# Patient Record
Sex: Male | Born: 1990 | Race: Black or African American | Hispanic: No | Marital: Single | Smoking: Current every day smoker
Health system: Southern US, Community
[De-identification: ages and names within clinical notes are randomized; demographics above are authoritative.]

## PROBLEM LIST (undated history)

## (undated) DIAGNOSIS — R569 Unspecified convulsions: Secondary | ICD-10-CM

---
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
## 2012-05-25 ENCOUNTER — Encounter (HOSPITAL_COMMUNITY): Payer: Self-pay | Admitting: Emergency Medicine

## 2012-05-25 DIAGNOSIS — S01501A Unspecified open wound of lip, initial encounter: Secondary | ICD-10-CM | POA: Insufficient documentation

## 2012-05-25 DIAGNOSIS — Y9389 Activity, other specified: Secondary | ICD-10-CM | POA: Insufficient documentation

## 2012-05-25 DIAGNOSIS — W208XXA Other cause of strike by thrown, projected or falling object, initial encounter: Secondary | ICD-10-CM | POA: Insufficient documentation

## 2012-05-25 DIAGNOSIS — Y998 Other external cause status: Secondary | ICD-10-CM | POA: Insufficient documentation

## 2012-05-25 DIAGNOSIS — F17200 Nicotine dependence, unspecified, uncomplicated: Secondary | ICD-10-CM | POA: Insufficient documentation

## 2012-05-25 NOTE — ED Notes (Signed)
PT. REPORTS ELECTRIC FAN ACCIDENTALLY FELL ON HIS FACE THIS EVENING , PRESENTS WITH UPPER LIP LACERATION APPROX. 1/2 INCH , BLEEDING CONTROLLED.

## 2012-05-26 ENCOUNTER — Emergency Department (HOSPITAL_COMMUNITY)
Admission: EM | Admit: 2012-05-26 | Discharge: 2012-05-26 | Disposition: A | Discharge status: Home / Self Care | Payer: Self-pay | Attending: Emergency Medicine | Admitting: Emergency Medicine

## 2012-05-26 DIAGNOSIS — S01511A Laceration without foreign body of lip, initial encounter: Secondary | ICD-10-CM

## 2012-05-26 NOTE — ED Provider Notes (Signed)
Medical screening examination/treatment/procedure(s) were performed by non-physician practitioner and as supervising physician I was immediately available for consultation/collaboration.  Olivia Mackie, MD 05/26/12 [phone]

## 2012-05-26 NOTE — ED Provider Notes (Signed)
History     CSN: [id number]  Arrival date & time 05/25/12  2343   None     Chief Complaint  Patient presents with  . Lip Laceration    (Consider location/radiation/quality/duration/timing/severity/associated sxs/prior treatment) HPI Comments: Patient state he removed the face of the fan to allow more, air to circulate, and in, his sleep he accidentally pulled the cord.  The fan fell onto his face lacerating his upper lip.  He denies any other injury.  He did not lose consciousness.  Bleeding has stopped with pressure.  He is up-to-date on his tetanus status  The history is provided by the patient.    History reviewed. No pertinent past medical history.  History reviewed. No pertinent past surgical history.  No family history on file.  History  Substance Use Topics  . Smoking status: Current Everyday Smoker  . Smokeless tobacco: Not on file  . Alcohol Use: No      Review of Systems  Constitutional: Negative for chills.  HENT: Negative for neck pain.   Skin: Positive for wound.  Neurological: Negative for dizziness and headaches.    Allergies  Review of patient's allergies indicates no known allergies.  Home Medications  No current outpatient prescriptions on file.  BP 122/76  Pulse 62  Temp 98.9 F (37.2 C) (Oral)  Resp 20  SpO2 100%  Physical Exam  Constitutional: He appears well-developed and well-nourished.  HENT:  Head: Normocephalic.  Eyes: Pupils are equal, round, and reactive to light.  Neck: Normal range of motion.  Cardiovascular: Normal rate.   Pulmonary/Chest: Effort normal.  Musculoskeletal: Normal range of motion.  Neurological: He is alert.  Skin: Skin is warm.       Laceration to upper lip, just above the vermilion border    ED Course  LACERATION REPAIR Date/Time: 05/26/2012 1:14 AM Performed by: Arman Filter Authorized by: Arman Filter Consent: Verbal consent obtained. Patient understanding: patient states understanding of  the procedure being performed Patient identity confirmed: verbally with patient Time out: Immediately prior to procedure a "time out" was called to verify the correct patient, procedure, equipment, support staff and site/side marked as required. Body area: head/neck Location details: upper lip Full thickness lip laceration: yes Vermillion border involved: no Laceration length: 1.5 cm Foreign bodies: metal Tendon involvement: none Nerve involvement: none Vascular damage: no Anesthesia: local infiltration Local anesthetic: lidocaine 1% without epinephrine Anesthetic total: 1.5 ml Patient sedated: no Preparation: Patient was prepped and draped in the usual sterile fashion. Irrigation solution: saline Amount of cleaning: standard Degree of undermining: none Skin closure: 6-0 Prolene Number of sutures: 4 Approximation: loose Approximation difficulty: simple Patient tolerance: Patient tolerated the procedure well with no immediate complications.   (including critical care time)  Labs Reviewed - No data to display No results found.   1. Lip laceration       MDM   Laceration to upper lip, just above the vermilion border        Arman Filter, NP 05/26/12 0116  Arman Filter, NP 05/26/12 [phone]

## 2016-05-02 ENCOUNTER — Encounter (HOSPITAL_COMMUNITY): Payer: Self-pay | Admitting: Emergency Medicine

## 2016-05-02 ENCOUNTER — Emergency Department (HOSPITAL_COMMUNITY)
Admission: EM | Admit: 2016-05-02 | Discharge: 2016-05-02 | Disposition: A | Discharge status: Home / Self Care | Payer: Self-pay | Attending: Emergency Medicine | Admitting: Emergency Medicine

## 2016-05-02 DIAGNOSIS — L0231 Cutaneous abscess of buttock: Secondary | ICD-10-CM | POA: Insufficient documentation

## 2016-05-02 DIAGNOSIS — L0291 Cutaneous abscess, unspecified: Secondary | ICD-10-CM

## 2016-05-02 DIAGNOSIS — F17200 Nicotine dependence, unspecified, uncomplicated: Secondary | ICD-10-CM | POA: Insufficient documentation

## 2016-05-02 MED ORDER — LIDOCAINE-EPINEPHRINE (PF) 2 %-1:200000 IJ SOLN
10.0000 mL | Freq: Once | INTRAMUSCULAR | Status: AC
  Administered 2016-05-02: 10 mL
  Filled 2016-05-02: qty 20

## 2016-05-02 NOTE — ED Provider Notes (Signed)
CSN: [id number]     Arrival date & time 05/02/16  0908 History  By signing my name below, I, Evon Slackerrance Branch, attest that this documentation has been prepared under the direction and in the presence of Bethel BornKelly Marie Ibeth Fahmy, PA-C. Electronically Signed: Evon Slackerrance Branch, ED Scribe. 05/02/2016. 9:40 AM.    Chief Complaint  Patient presents with  . Abscess   The history is provided by the patient. No language interpreter was used.   HPI Comments: Townsend RogerLeonel Brickman is a 25 y.o. male with no significant medical history who presents to the Emergency Department complaining of abscess to right buttock onset 2 days prior. He reports associated pain. Pt denies any medications PTA. Pt states that the pain is worse when sitting on his buttocks. He works in [employer] and sits for most of the day using equipment. Pt denies drainage, fever nausea or vomiting. Reports hx of similar abscesses that resolved on its own.   History reviewed. No pertinent past medical history. History reviewed. No pertinent past surgical history. No family history on file. Social History  Substance Use Topics  . Smoking status: Current Every Day Smoker  . Smokeless tobacco: None  . Alcohol Use: No    Review of Systems  Constitutional: Negative for fever.  Gastrointestinal: Negative for nausea and vomiting.  Skin: Negative for wound.       +abscess right buttock     Allergies  Review of patient's allergies indicates no known allergies.  Home Medications   Prior to Admission medications   Not on File   BP 159/101 mmHg  Pulse 78  Temp(Src) 98.9 F (37.2 C) (Oral)  Resp 20  SpO2 99%   Physical Exam  Constitutional: He is oriented to person, place, and time. He appears well-developed and well-nourished. No distress.  HENT:  Head: Normocephalic and atraumatic.  Eyes: Conjunctivae are normal. Pupils are equal, round, and reactive to light. Right eye exhibits no discharge. Left eye exhibits no discharge. No scleral  icterus.  Neck: Normal range of motion.  Cardiovascular: Normal rate.   Pulmonary/Chest: Effort normal. No respiratory distress.  Abdominal: Soft. He exhibits no distension.  Neurological: He is alert and oriented to person, place, and time.  Skin: Skin is warm and dry.  3cm fluctuant perirectal abscess confirmed with US. No drainage. Minimal erythema. Very tender to palpation  Psychiatric: He has a normal mood and affect.    ED Course  Procedures (including critical care time) DIAGNOSTIC STUDIES: Oxygen Saturation is 99% on RA, normal by my interpretation.    COORDINATION OF CARE: 9:39 AM-Discussed treatment plan which includes ultrasound buttock with pt at bedside and pt agreed to plan.     Labs Review Labs Reviewed - No data to display  Imaging Review No results found.    EKG Interpretation None     INCISION AND DRAINAGE Performed by: Bethel BornKelly Marie Daisha Filosa Consent: Verbal consent obtained. Risks and benefits: risks, benefits and alternatives were discussed Type: abscess  Body area: right buttock  Anesthesia: local infiltration  Incision was made with a scalpel (11 blade)  Local anesthetic: lidocaine 2% with epinephrine  Anesthetic total: 5 ml  Complexity: simple Blunt dissection to break up loculations  Drainage: purulent  Drainage amount: 3cc  Packing material: 1/4 in iodoform gauze  Patient tolerance: Patient tolerated the procedure well with no immediate complications.    MDM   Final diagnoses:  Abscess   25 year old male who presents with a perirectal abscess. I&D performed with significant relief.  Wound was packed and dressed. Advised follow up in 2 days for wound recheck and possible repacking. Discussed keeping area clean and dry, especially after a BM. Antibiotics not indicated at this time as patient has no significant medical history.  I personally performed the services described in this documentation, which was scribed in my presence. The  recorded information has been reviewed and is accurate.      Bethel BornKelly Marie Juanice Warburton, PA-C 05/02/16 1320  Geoffery Lyonsouglas Delo, MD 05/02/16 1539

## 2016-05-02 NOTE — ED Notes (Signed)
Pt reports he has an "abscess on my butt". Onset Wednesday (2 days ago). Hx of same. Girlfriend at bedside.

## 2016-05-02 NOTE — Discharge Instructions (Signed)
Incision and Drainage Incision and drainage is a procedure in which a sac-like structure (cystic structure) is opened and drained. The area to be drained usually contains material such as pus, fluid, or blood.  LET YOUR CAREGIVER KNOW ABOUT:   Allergies to medicine.  Medicines taken, including vitamins, herbs, eyedrops, over-the-counter medicines, and creams.  Use of steroids (by mouth or creams).  Previous problems with anesthetics or numbing medicines.  History of bleeding problems or blood clots.  Previous surgery.  Other health problems, including diabetes and kidney problems.  Possibility of pregnancy, if this applies. RISKS AND COMPLICATIONS  Pain.  Bleeding.  Scarring.  Infection. BEFORE THE PROCEDURE  You may need to have an ultrasound or other imaging tests to see how large or deep your cystic structure is. Blood tests may also be used to determine if you have an infection or how severe the infection is. You may need to have a tetanus shot. PROCEDURE  The affected area is cleaned with a cleaning fluid. The cyst area will then be numbed with a medicine (local anesthetic). A small incision will be made in the cystic structure. A syringe or catheter may be used to drain the contents of the cystic structure, or the contents may be squeezed out. The area will then be flushed with a cleansing solution. After cleansing the area, it is often gently packed with a gauze or another wound dressing. Once it is packed, it will be covered with gauze and tape or some other type of wound dressing. AFTER THE PROCEDURE   Often, you will be allowed to go home right after the procedure.  You may be given antibiotic medicine to prevent or heal an infection.  If the area was packed with gauze or some other wound dressing, you will likely need to come back in 1 to 2 days to get it removed.  The area should heal in about 14 days.   This information is not intended to replace advice given  to you by your health care provider. Make sure you discuss any questions you have with your health care provider.   Document Released: 04/08/2001 Document Revised: 04/13/2012 Document Reviewed: 12/08/2011 Elsevier Interactive Patient Education 2016 Else[employer] Inc[employer]  

## 2016-08-04 ENCOUNTER — Encounter (HOSPITAL_COMMUNITY): Payer: Self-pay

## 2016-08-04 ENCOUNTER — Emergency Department (HOSPITAL_COMMUNITY): Payer: Self-pay

## 2016-08-04 DIAGNOSIS — R0602 Shortness of breath: Secondary | ICD-10-CM | POA: Insufficient documentation

## 2016-08-04 DIAGNOSIS — F17200 Nicotine dependence, unspecified, uncomplicated: Secondary | ICD-10-CM | POA: Insufficient documentation

## 2016-08-04 DIAGNOSIS — R002 Palpitations: Secondary | ICD-10-CM | POA: Insufficient documentation

## 2016-08-04 LAB — CBC
HEMATOCRIT: 41.3 % (ref 39.0–52.0)
HEMOGLOBIN: 14.3 g/dL (ref 13.0–17.0)
MCH: 31.6 pg (ref 26.0–34.0)
MCHC: 34.6 g/dL (ref 30.0–36.0)
MCV: 91.2 fL (ref 78.0–100.0)
Platelets: 182 10*3/uL (ref 150–400)
RBC: 4.53 MIL/uL (ref 4.22–5.81)
RDW: 14.5 % (ref 11.5–15.5)
WBC: 6 10*3/uL (ref 4.0–10.5)

## 2016-08-04 LAB — BASIC METABOLIC PANEL
ANION GAP: 15 (ref 5–15)
BUN: <5 mg/dL — ABNORMAL LOW (ref 6–20)
CO2: 23 mmol/L (ref 22–32)
Calcium: 9.2 mg/dL (ref 8.9–10.3)
Chloride: 102 mmol/L (ref 101–111)
Creatinine, Ser: 0.84 mg/dL (ref 0.61–1.24)
GFR calc Af Amer: >60 mL/min (ref >60)
GFR calc non Af Amer: >60 mL/min (ref >60)
GLUCOSE: 149 mg/dL — AB (ref 65–99)
POTASSIUM: 3.6 mmol/L (ref 3.5–5.1)
Sodium: 140 mmol/L (ref 135–145)

## 2016-08-04 LAB — I-STAT TROPONIN, ED: Troponin i, poc: 0 ng/mL (ref 0.00–0.08)

## 2016-08-04 NOTE — ED Triage Notes (Signed)
Pt complaining of generalized body aches and SOB. Pt denies any cough or chest pain. Pt complaining of vague symptoms. Pt denies any injury/trauma.

## 2016-08-05 ENCOUNTER — Emergency Department (HOSPITAL_COMMUNITY)
Admission: EM | Admit: 2016-08-05 | Discharge: 2016-08-05 | Disposition: A | Discharge status: Home / Self Care | Payer: Self-pay | Attending: Emergency Medicine | Admitting: Emergency Medicine

## 2016-08-05 DIAGNOSIS — R002 Palpitations: Secondary | ICD-10-CM

## 2016-08-05 DIAGNOSIS — R0602 Shortness of breath: Secondary | ICD-10-CM

## 2016-08-05 NOTE — ED Provider Notes (Signed)
[facility]-EMERGENCY DEPT Provider Note   CSN: [id number] Arrival date & time: 08/04/16  2137  By signing my name below, I, Rosario AdieWilliam Andrew Hiatt, attest that this documentation has been prepared under the direction and in the presence of Tomasita CrumbleAdeleke Adriane Gabbert, MD. Electronically Signed: Rosario AdieWilliam Andrew Hiatt, ED Scribe. 08/05/16. 2:59 AM.  History    Chief Complaint  Patient presents with  . Shortness of Breath   The history is provided by the patient. No language interpreter was used.   HPI Comments: John Krueger is a 25 y.o. male with no other pertinent PMHx, who presents to the Emergency Department complaining of intermittent, recurrent episodes of sensation of palpitations w/ associated SOB onset ~2 months ago. Pt reports that his episodes are typically only associated with mild exertion and that "driving" will exacerbate his symptoms. He states that his symptoms will be relieved by sleeping. No other treatments were tired prior to coming into the ED. No h/o cardiac issues. He denies any recent trauma or injuries. Denies cough, chest pain, fevers, or any other associated symptoms.    History reviewed. No pertinent past medical history.  There are no active problems to display for this patient.  History reviewed. No pertinent surgical history.  Home Medications    Prior to Admission medications   Not on File   Family History History reviewed. No pertinent family history.   Social History Social History  Substance Use Topics  . Smoking status: Current Every Day Smoker  . Smokeless tobacco: Not on file  . Alcohol use No   Allergies   Review of patient's allergies indicates no known allergies.  Review of Systems Review of Systems  A complete 10 system review of systems was obtained and all systems are negative except as noted in the HPI and PMH.   Physical Exam Updated Vital Signs BP 126/93   Pulse 81   Temp 98.5 F (36.9 C) (Oral)   Resp 20   SpO2 98%   Physical Exam    Constitutional: He is oriented to person, place, and time. Vital signs are normal. He appears well-developed and well-nourished.  Non-toxic appearance. He does not appear ill. No distress.  HENT:  Head: Normocephalic and atraumatic.  Nose: Nose normal.  Mouth/Throat: Oropharynx is clear and moist. No oropharyngeal exudate.  Eyes: Conjunctivae and EOM are normal. Pupils are equal, round, and reactive to light. No scleral icterus.  Neck: Normal range of motion. Neck supple. No tracheal deviation, no edema, no erythema and normal range of motion present. No thyroid mass and no thyromegaly present.  Cardiovascular: Normal rate, regular rhythm, S1 normal, S2 normal, normal heart sounds, intact distal pulses and normal pulses.  Exam reveals no gallop and no friction rub.   No murmur heard. Pulmonary/Chest: Effort normal and breath sounds normal. No respiratory distress. He has no wheezes. He has no rhonchi. He has no rales.  Abdominal: Soft. Normal appearance and bowel sounds are normal. He exhibits no distension, no ascites and no mass. There is no hepatosplenomegaly. There is no tenderness. There is no rebound, no guarding and no CVA tenderness.  Musculoskeletal: Normal range of motion. He exhibits no edema or tenderness.  Lymphadenopathy:    He has no cervical adenopathy.  Neurological: He is alert and oriented to person, place, and time. He has normal strength. No cranial nerve deficit or sensory deficit.  Skin: Skin is warm, dry and intact. No petechiae and no rash noted. He is not diaphoretic. No erythema. No pallor.  Nursing  note and vitals reviewed.  ED Treatments / Results  DIAGNOSTIC STUDIES: Oxygen Saturation is 98% on RA, normal by my interpretation.   COORDINATION OF CARE: 2:46 AM-Discussed next steps with pt. Pt verbalized understanding and is agreeable with the plan.   Labs (all labs ordered are listed, but only abnormal results are displayed) Labs Reviewed  BASIC METABOLIC  PANEL - Abnormal; Notable for the following:       Result Value   Glucose, Bld 149 (*)    BUN <5 (*)    All other components within normal limits  CBC  I-STAT TROPOININ, ED   EKG  EKG Interpretation  Date/Time:  Monday August 04 2016 21:46:23 EDT Ventricular Rate:  86 PR Interval:  130 QRS Duration: 86 QT Interval:  326 QTC Calculation: 390 R Axis:   54 Text Interpretation:  Normal sinus rhythm Minimal voltage criteria for LVH, may be normal variant Borderline ECG No old tracing to compare Confirmed by Erroll Luna [id number]) on 08/05/2016 2:35:19 AM      Radiology Dg Chest 2 View  Result Date: 08/04/2016 CLINICAL DATA:  Shortness of breath, generalized body aches. EXAM: CHEST  2 VIEW COMPARISON:  03/07/2011 FINDINGS: The cardiomediastinal contours are normal. The lungs are clear. Pulmonary vasculature is normal. No consolidation, pleural effusion, or pneumothorax. No acute osseous abnormalities are seen. IMPRESSION: No acute pulmonary process. Electronically Signed   By: Rubye Oaks M.D.   On: 08/04/2016 22:47   Procedures Procedures   Medications Ordered in ED Medications - No data to display  Initial Impression / Assessment and Plan / ED Course  I have reviewed the triage vital signs and the nursing notes.  Pertinent labs & imaging results that were available during my care of the patient were reviewed by me and considered in my medical decision making (see chart for details).  Clinical Course   Patient presents emergency department for sudden onset shortness of breath and palpitations. This has been going on for a long time. I do not believe this represents ACS. He has perked negative. Troponin I was lubricated ordered by triage is negative. EKG is unremarkable. Chest x-ray is normal. Laboratory studies are normal. Patient advised to follow-up with primary care physician for further care. He appears well in no acute distress. There is no hypoxia or tachycardia.  He is calm. Vital signs were within his normal limits and he is safe for discharge.  Final Clinical Impressions(s) / ED Diagnoses   Final diagnoses:  None   New Prescriptions New Prescriptions   No medications on file   I personally performed the services described in this documentation, which was scribed in my presence. The recorded information has been reviewed and is accurate.       Tomasita Crumble, MD 08/05/16 [phone]

## 2018-02-20 ENCOUNTER — Emergency Department (HOSPITAL_COMMUNITY)
Admission: EM | Admit: 2018-02-20 | Discharge: 2018-02-21 | Disposition: A | Discharge status: Home / Self Care | Payer: Self-pay | Attending: Emergency Medicine | Admitting: Emergency Medicine

## 2018-02-20 ENCOUNTER — Encounter (HOSPITAL_COMMUNITY): Payer: Self-pay | Admitting: Emergency Medicine

## 2018-02-20 ENCOUNTER — Other Ambulatory Visit: Payer: Self-pay

## 2018-02-20 DIAGNOSIS — K529 Noninfective gastroenteritis and colitis, unspecified: Secondary | ICD-10-CM | POA: Insufficient documentation

## 2018-02-20 DIAGNOSIS — F17200 Nicotine dependence, unspecified, uncomplicated: Secondary | ICD-10-CM | POA: Insufficient documentation

## 2018-02-20 LAB — CBC
HCT: 40.4 % (ref 39.0–52.0)
HEMOGLOBIN: 13.9 g/dL (ref 13.0–17.0)
MCH: 30.6 pg (ref 26.0–34.0)
MCHC: 34.4 g/dL (ref 30.0–36.0)
MCV: 89 fL (ref 78.0–100.0)
PLATELETS: 102 10*3/uL — AB (ref 150–400)
RBC: 4.54 MIL/uL (ref 4.22–5.81)
RDW: 14.2 % (ref 11.5–15.5)
WBC: 6.6 10*3/uL (ref 4.0–10.5)

## 2018-02-20 LAB — COMPREHENSIVE METABOLIC PANEL
ALK PHOS: 109 U/L (ref 38–126)
ALT: 82 U/L — ABNORMAL HIGH (ref 17–63)
ANION GAP: 11 (ref 5–15)
AST: 100 U/L — ABNORMAL HIGH (ref 15–41)
Albumin: 3.9 g/dL (ref 3.5–5.0)
BILIRUBIN TOTAL: 1 mg/dL (ref 0.3–1.2)
BUN: 7 mg/dL (ref 6–20)
CALCIUM: 9.2 mg/dL (ref 8.9–10.3)
CO2: 22 mmol/L (ref 22–32)
CREATININE: 0.95 mg/dL (ref 0.61–1.24)
Chloride: 97 mmol/L — ABNORMAL LOW (ref 101–111)
Glucose, Bld: 117 mg/dL — ABNORMAL HIGH (ref 65–99)
Potassium: 3.8 mmol/L (ref 3.5–5.1)
SODIUM: 130 mmol/L — AB (ref 135–145)
TOTAL PROTEIN: 7.6 g/dL (ref 6.5–8.1)

## 2018-02-20 LAB — TYPE AND SCREEN
ABO/RH(D): O POS
Antibody Screen: NEGATIVE

## 2018-02-20 NOTE — ED Triage Notes (Signed)
Patient presents to ED for assessment of 5 episodes of rectal bleeding today with emesis and abdominal pain.  Tender with palpation.  Denies known injury.  Denies blood thinner.   Patient states he cannot keep anything down.

## 2018-02-21 ENCOUNTER — Emergency Department (HOSPITAL_COMMUNITY): Payer: Self-pay

## 2018-02-21 LAB — ABO/RH: ABO/RH(D): O POS

## 2018-02-21 LAB — POC OCCULT BLOOD, ED: FECAL OCCULT BLD: POSITIVE — AB

## 2018-02-21 MED ORDER — CIPROFLOXACIN HCL 500 MG PO TABS: 500.0000 mg | ORAL_TABLET | Freq: Two times a day (BID) | ORAL | 0 refills | Status: DC

## 2018-02-21 MED ORDER — IOPAMIDOL (ISOVUE-300) INJECTION 61%
INTRAVENOUS | Status: AC
  Filled 2018-02-21: qty 100

## 2018-02-21 MED ORDER — HYDROCODONE-ACETAMINOPHEN 5-325 MG PO TABS: 1.0000 | ORAL_TABLET | Freq: Four times a day (QID) | ORAL | 0 refills | Status: DC | PRN

## 2018-02-21 MED ORDER — METRONIDAZOLE 500 MG PO TABS: 500.0000 mg | ORAL_TABLET | Freq: Three times a day (TID) | ORAL | 0 refills | Status: DC

## 2018-02-21 MED ORDER — SODIUM CHLORIDE 0.9 % IV BOLUS
1000.0000 mL | Freq: Once | INTRAVENOUS | Status: AC
  Administered 2018-02-21: 1000 mL via INTRAVENOUS

## 2018-02-21 MED ORDER — IOPAMIDOL (ISOVUE-300) INJECTION 61%
100.0000 mL | Freq: Once | INTRAVENOUS | Status: AC | PRN
  Administered 2018-02-21: 100 mL via INTRAVENOUS

## 2018-02-21 NOTE — ED Notes (Signed)
To CT

## 2018-02-21 NOTE — ED Provider Notes (Signed)
[name] EMERGENCY DEPARTMENT Provider Note   CSN: [id number] Arrival date & time: 02/20/18  1948     History   Chief Complaint Chief Complaint  Patient presents with  . Rectal Bleeding    HPI John Krueger is a 27 y.o. male.  Patient is a 27 year old male presenting with complaints of abdominal pain and rectal bleeding.  This is been ongoing for 24 hours.  He states that he ate at a restaurant prior to the onset of symptoms and thought that he had food poisoning.  He has been taking Pepto-Bismol with little relief.  He reports pain in his lower abdomen along with loose, bloody stools.  The stool is bright red.  The history is provided by the patient.  Rectal Bleeding  Quality:  Bright red Amount:  Moderate Duration:  1 day Timing:  Constant Chronicity:  New Context: diarrhea   Context: not anal fissures, not hemorrhoids and not rectal injury   Relieved by:  Nothing Worsened by:  Nothing   History reviewed. No pertinent past medical history.  There are no active problems to display for this patient.   History reviewed. No pertinent surgical history.      Home Medications    Prior to Admission medications   Not on File    Family History History reviewed. No pertinent family history.  Social History Social History   Tobacco Use  . Smoking status: Current Every Day Smoker  . Smokeless tobacco: Never Used  Substance Use Topics  . Alcohol use: No  . Drug use: No     Allergies   Patient has no known allergies.   Review of Systems Review of Systems  Gastrointestinal: Positive for hematochezia.  All other systems reviewed and are negative.    Physical Exam Updated Vital Signs BP (!) 136/98 (BP Location: Right Arm)   Pulse (!) 101   Temp 99.6 F (37.6 C) (Oral)   Resp 16   SpO2 99%   Physical Exam  Constitutional: He is oriented to person, place, and time. He appears well-developed and well-nourished. No distress.  HENT:    Head: Normocephalic and atraumatic.  Mouth/Throat: Oropharynx is clear and moist.  Neck: Normal range of motion. Neck supple.  Cardiovascular: Normal rate and regular rhythm. Exam reveals no friction rub.  No murmur heard. Pulmonary/Chest: Effort normal and breath sounds normal. No respiratory distress. He has no wheezes. He has no rales.  Abdominal: Soft. Bowel sounds are normal. He exhibits no distension. There is tenderness. There is no rebound and no guarding.  There is tenderness to palpation across the lower abdomen in the suprapubic region, right lower quadrant, and left lower quadrant.  Genitourinary: Rectum normal.  Genitourinary Comments: There is bright red blood present in the rectum.  There are no obvious fissures or hemorrhoids.  Musculoskeletal: Normal range of motion. He exhibits no edema.  Neurological: He is alert and oriented to person, place, and time. Coordination normal.  Skin: Skin is warm and dry. He is not diaphoretic.  Nursing note and vitals reviewed.    ED Treatments / Results  Labs (all labs ordered are listed, but only abnormal results are displayed) Labs Reviewed  COMPREHENSIVE METABOLIC PANEL - Abnormal; Notable for the following components:      Result Value   Sodium 130 (*)    Chloride 97 (*)    Glucose, Bld 117 (*)    AST 100 (*)    ALT 82 (*)    All other  components within normal limits  CBC - Abnormal; Notable for the following components:   Platelets 102 (*)    All other components within normal limits  POC OCCULT BLOOD, ED  POC OCCULT BLOOD, ED  TYPE AND SCREEN  ABO/RH    EKG None  Radiology No results found.  Procedures Procedures (including critical care time)  Medications Ordered in ED Medications  sodium chloride 0.9 % bolus 1,000 mL (has no administration in time range)     Initial Impression / Assessment and Plan / ED Course  I have reviewed the triage vital signs and the nursing notes.  Pertinent labs & imaging  results that were available during my care of the patient were reviewed by me and considered in my medical decision making (see chart for details).  Patient presents with suprapubic abdominal pain and passing bloody stools since yesterday.  He also reports diarrhea.  His work-up today reveals no elevation of white count, but he does have gross blood on rectal exam.  His CT scan today shows what appears to be colitis.  He will be treated with Cipro and Flagyl, pain medicine, and will follow up with gastroenterology later this week.  I am uncertain as to whether the etiology of his colitis is infectious or related to an inflammatory cause, but favor infectious given his presentation.  Final Clinical Impressions(s) / ED Diagnoses   Final diagnoses:  None    ED Discharge Orders    None       Geoffery Lyons, MD 02/21/18 [phone]

## 2018-02-21 NOTE — Discharge Instructions (Signed)
Cipro and Flagyl as prescribed.  Hydrocodone as prescribed as needed for pain.  Follow-up with gastroenterology in the next 3 to 4 days.  The contact information for [facility] has been provided in this discharge summary for you to call and make these arrangements.  Return to the emergency department in the meantime if you develop worsening pain, high fevers, worsening bleeding, or other new and concerning symptoms.

## 2020-03-05 ENCOUNTER — Other Ambulatory Visit: Payer: Self-pay

## 2020-03-05 ENCOUNTER — Encounter (HOSPITAL_COMMUNITY): Payer: Self-pay

## 2020-03-05 ENCOUNTER — Emergency Department (HOSPITAL_COMMUNITY)
Admission: EM | Admit: 2020-03-05 | Discharge: 2020-03-06 | Disposition: A | Discharge status: Home / Self Care | Payer: Self-pay | Attending: Emergency Medicine | Admitting: Emergency Medicine

## 2020-03-05 DIAGNOSIS — F17210 Nicotine dependence, cigarettes, uncomplicated: Secondary | ICD-10-CM | POA: Insufficient documentation

## 2020-03-05 DIAGNOSIS — E860 Dehydration: Secondary | ICD-10-CM | POA: Insufficient documentation

## 2020-03-05 DIAGNOSIS — R404 Transient alteration of awareness: Secondary | ICD-10-CM | POA: Insufficient documentation

## 2020-03-05 LAB — CBG MONITORING, ED: Glucose-Capillary: 108 mg/dL — ABNORMAL HIGH (ref 70–99)

## 2020-03-05 NOTE — ED Triage Notes (Signed)
Pt bib g[facility]ms w/ c/o possible seizure. 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 Coworkers did not see any seizure-like activity, however pt is amnesic to event, was AOx1 when waking up, and foaming at the mouth per EMS. Pt AOx3/4 w/ EMS and able to reorient. No hx of seizures, no incontinence noted. CBG 115  EMS VS:  138/90 HR 110 RR 20 SPO2 100%

## 2020-03-06 ENCOUNTER — Emergency Department (HOSPITAL_COMMUNITY): Payer: Self-pay

## 2020-03-06 LAB — URINALYSIS, ROUTINE W REFLEX MICROSCOPIC
Bacteria, UA: NONE SEEN
Bilirubin Urine: NEGATIVE
Glucose, UA: NEGATIVE mg/dL
Ketones, ur: 5 mg/dL — AB
Leukocytes,Ua: NEGATIVE
Nitrite: NEGATIVE
Protein, ur: 100 mg/dL — AB
Specific Gravity, Urine: 1.017 (ref 1.005–1.030)
pH: 5 (ref 5.0–8.0)

## 2020-03-06 LAB — COMPREHENSIVE METABOLIC PANEL
ALT: 100 U/L — ABNORMAL HIGH (ref 0–44)
AST: 130 U/L — ABNORMAL HIGH (ref 15–41)
Albumin: 4.5 g/dL (ref 3.5–5.0)
Alkaline Phosphatase: 90 U/L (ref 38–126)
Anion gap: 20 — ABNORMAL HIGH (ref 5–15)
BUN: 8 mg/dL (ref 6–20)
CO2: 18 mmol/L — ABNORMAL LOW (ref 22–32)
Calcium: 9.8 mg/dL (ref 8.9–10.3)
Chloride: 97 mmol/L — ABNORMAL LOW (ref 98–111)
Creatinine, Ser: 1.03 mg/dL (ref 0.61–1.24)
GFR calc Af Amer: >60 mL/min (ref >60)
GFR calc non Af Amer: >60 mL/min (ref >60)
Glucose, Bld: 106 mg/dL — ABNORMAL HIGH (ref 70–99)
Potassium: 4.5 mmol/L (ref 3.5–5.1)
Sodium: 135 mmol/L (ref 135–145)
Total Bilirubin: 0.7 mg/dL (ref 0.3–1.2)
Total Protein: 8 g/dL (ref 6.5–8.1)

## 2020-03-06 LAB — ETHANOL: Alcohol, Ethyl (B): <10 mg/dL (ref <10)

## 2020-03-06 LAB — RAPID URINE DRUG SCREEN, HOSP PERFORMED
Amphetamines: NOT DETECTED
Barbiturates: NOT DETECTED
Benzodiazepines: NOT DETECTED
Cocaine: NOT DETECTED
Opiates: NOT DETECTED
Tetrahydrocannabinol: NOT DETECTED

## 2020-03-06 LAB — CBC WITH DIFFERENTIAL/PLATELET
Abs Immature Granulocytes: 0.02 10*3/uL (ref 0.00–0.07)
Basophils Absolute: 0 10*3/uL (ref 0.0–0.1)
Basophils Relative: 1 %
Eosinophils Absolute: 0.1 10*3/uL (ref 0.0–0.5)
Eosinophils Relative: 1 %
HCT: 41 % (ref 39.0–52.0)
Hemoglobin: 13.8 g/dL (ref 13.0–17.0)
Immature Granulocytes: 0 %
Lymphocytes Relative: 31 %
Lymphs Abs: 1.5 10*3/uL (ref 0.7–4.0)
MCH: 30.9 pg (ref 26.0–34.0)
MCHC: 33.7 g/dL (ref 30.0–36.0)
MCV: 91.9 fL (ref 80.0–100.0)
Monocytes Absolute: 0.5 10*3/uL (ref 0.1–1.0)
Monocytes Relative: 10 %
Neutro Abs: 2.8 10*3/uL (ref 1.7–7.7)
Neutrophils Relative %: 57 %
Platelets: 113 10*3/uL — ABNORMAL LOW (ref 150–400)
RBC: 4.46 MIL/uL (ref 4.22–5.81)
RDW: 15.8 % — ABNORMAL HIGH (ref 11.5–15.5)
WBC: 5 10*3/uL (ref 4.0–10.5)
nRBC: 0 % (ref 0.0–0.2)

## 2020-03-06 LAB — ACETAMINOPHEN LEVEL: Acetaminophen (Tylenol), Serum: <10 ug/mL — ABNORMAL LOW (ref 10–30)

## 2020-03-06 LAB — SALICYLATE LEVEL: Salicylate Lvl: <7 mg/dL — ABNORMAL LOW (ref 7.0–30.0)

## 2020-03-06 LAB — AMMONIA: Ammonia: 53 umol/L — ABNORMAL HIGH (ref 9–35)

## 2020-03-06 LAB — LACTIC ACID, PLASMA: Lactic Acid, Venous: 2.6 mmol/L (ref 0.5–1.9)

## 2020-03-06 MED ORDER — LACTATED RINGERS IV BOLUS
2000.0000 mL | Freq: Once | INTRAVENOUS | Status: AC
  Administered 2020-03-06: 2000 mL via INTRAVENOUS

## 2020-03-06 NOTE — ED Notes (Signed)
Urine culture sent down with u/a and uds

## 2020-03-06 NOTE — ED Notes (Signed)
Pt ambulated in hallway, noted to have a steady gait.

## 2020-03-06 NOTE — ED Notes (Signed)
Informed pt of need for urine specimen. Verbalizes understanding.  

## 2020-03-06 NOTE — ED Notes (Signed)
Discharge instructions reviewed with pt. Pt verbalized understanding that he is to follow up with neurology and is not allowed to drive until he is cleared by them

## 2020-03-06 NOTE — ED Provider Notes (Signed)
[name] EMERGENCY DEPARTMENT Provider Note   CSN: [id number] Arrival date & time: 03/05/20  2323     History Chief Complaint  Patient presents with  . Seizures    Khameron Gruenwald is a 29 y.o. male.  The history is provided by the patient.  Altered Mental Status Presenting symptoms: confusion   Severity:  Moderate Progression:  Improving Chronicity:  New Context: not alcohol use, not head injury, taking medications as prescribed and not recent change in medication   Associated symptoms: no fever and no weakness   Patient is an otherwise healthy 29 year old male.  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  He took a smoking break and started to feel different and dizzy.  Patient cannot recall the events of what happened at work.  Coworkers reported patient was unresponsive for 2 minutes.  No seizure activity was reported, but he was amnestic to the event.  EMS reports patient was "foaming at the mouth " Patient has no known history of seizures.  No incontinence.  No tongue biting. He reports he has been feeling well recently. He reports he smokes [place] cigarettes, no drug use    PMH-none Soc hx - denies drug/etoh use, smokes cigarettes Social History   Tobacco Use  . Smoking status: Current Every Day Smoker  . Smokeless tobacco: Never Used  Substance Use Topics  . Alcohol use: No  . Drug use: No    Home Medications Prior to Admission medications   Not on File    Allergies    Patient has no known allergies.  Review of Systems   Review of Systems  Constitutional: Negative for fever.  Respiratory: Negative for shortness of breath.   Cardiovascular: Negative for chest pain.  Neurological: Negative for weakness.  Psychiatric/Behavioral: Positive for confusion.  All other systems reviewed and are negative.   Physical Exam Updated Vital Signs BP 134/87 (BP Location: Left Arm)   Pulse  (!) 103   Temp 99.2 F (37.3 C) (Oral)   Resp 16   SpO2 97%   Physical Exam CONSTITUTIONAL: Well developed/well nourished HEAD: Normocephalic/atraumatic EYES: EOMI/PERRL ENMT: Mucous membranes moist NECK: supple no meningeal signs SPINE/BACK:entire spine nontender CV: S1/S2 noted, no murmurs/rubs/gallops noted LUNGS: Lungs are clear to auscultation bilaterally, no apparent distress ABDOMEN: soft, nontender, no rebound or guarding, bowel sounds noted throughout abdomen GU:no cva tenderness NEURO: Pt is awake/alert/appropriate, moves all extremitiesx4.  No facial droop.  EXTREMITIES: pulses normal/equal, full ROM SKIN: warm, color normal PSYCH: no abnormalities of mood noted, alert and oriented to situation ED Results / Procedures / Treatments   Labs (all labs ordered are listed, but only abnormal results are displayed) Labs Reviewed  CBC WITH DIFFERENTIAL/PLATELET - Abnormal; Notable for the following components:      Result Value   RDW 15.8 (*)    Platelets 113 (*)    All other components within normal limits  COMPREHENSIVE METABOLIC PANEL - Abnormal; Notable for the following components:   Chloride 97 (*)    CO2 18 (*)    Glucose, Bld 106 (*)    AST 130 (*)    ALT 100 (*)    Anion gap 20 (*)    All other components within normal limits  URINALYSIS, ROUTINE W REFLEX MICROSCOPIC - Abnormal; Notable for the following components:   APPearance HAZY (*)    Hgb urine dipstick SMALL (*)    Ketones, ur  5 (*)    Protein, ur 100 (*)    All other components within normal limits  AMMONIA - Abnormal; Notable for the following components:   Ammonia 53 (*)    All other components within normal limits  LACTIC ACID, PLASMA - Abnormal; Notable for the following components:   Lactic Acid, Venous 2.6 (*)    All other components within normal limits  SALICYLATE LEVEL - Abnormal; Notable for the following components:   Salicylate Lvl <7.0 (*)    All other components within normal limits   ACETAMINOPHEN LEVEL - Abnormal; Notable for the following components:   Acetaminophen (Tylenol), Serum <10 (*)    All other components within normal limits  CBG MONITORING, ED - Abnormal; Notable for the following components:   Glucose-Capillary 108 (*)    All other components within normal limits  ETHANOL  RAPID URINE DRUG SCREEN, HOSP PERFORMED    EKG EKG Interpretation  Date/Time:  Monday Mar 05 2020 23:58:36 EDT Ventricular Rate:  88 PR Interval:  130 QRS Duration: 82 QT Interval:  332 QTC Calculation: 401 R Axis:   60 Text Interpretation: Normal sinus rhythm Cannot rule out Anterior infarct , age undetermined Abnormal ECG No significant change since last tracing Confirmed by Zadie Rhine ([id number]) on 03/06/2020 12:06:34 AM   Radiology CT Head Wo Contrast  Result Date: 03/06/2020 CLINICAL DATA:  Altered mental status, possible seizure EXAM: CT HEAD WITHOUT CONTRAST TECHNIQUE: Contiguous axial images were obtained from the base of the skull through the vertex without intravenous contrast. COMPARISON:  None. FINDINGS: Brain: No evidence of acute territorial infarction, hemorrhage, hydrocephalus,extra-axial collection or mass lesion/mass effect. Normal gray-white differentiation. There is dilatation the ventricles and sulci out of proportion given the patient's age. Vascular: No hyperdense vessel or unexpected calcification. Skull: The skull is intact. No fracture or focal lesion identified. Sinuses/Orbits: The visualized paranasal sinuses and mastoid air cells are clear. The orbits and globes intact. Other: None IMPRESSION: No acute intracranial abnormality. Diffuse cerebral atrophy, advanced given the patient's age. Electronically Signed   By: Jonna Clark M.D.   On: 03/06/2020 00:25    Procedures Procedures    Medications Ordered in ED Medications  lactated ringers bolus 2,000 mL (2,000 mLs Intravenous New Bag/Given 03/06/20 0146)    ED Course  I have reviewed the triage  vital signs and the nursing notes.  Pertinent labs & imaging results that were available during my care of the patient were reviewed by me and considered in my medical decision making (see chart for details).    MDM Rules/Calculators/A&P                     12:05 AM Patient presents from work after an episode of altered mental status or possible seizure.  He appears to be nearly back to baseline at this time.  However he was initially amnestic to the event.  Altered mental status work-up has been initiated 2:38 AM Patient is improved.  No acute distress.  Labs reveal dehydration but otherwise near baseline Patient will need to be kept out of work and will refer to neurology for close follow-up.  It is unclear exactly what happened tonight the patient may have had a seizure at work. 4:42 AM Patient is improved BP 130/86 (BP Location: Right Arm)   Pulse 81   Temp 98.9 F (37.2 C) (Oral)   Resp 20   Ht 1.753 m (5\' 9" )   Wt 79.4 kg   SpO2 97%  BMI 25.84 kg/m  He is ambulatory in no distress.  He is taking p.o. fluids.  He is afebrile.  Vitals appropriate He is back to baseline.  Mental status appropriate.  No focal weakness. He still has difficulty recalling exactly what occurred at work.  I have a strong suspicion that he may have had a seizure though no witnesses were available. Labs revealed mildly elevated lactate, dehydration I suspect is due to a seizure.  Patient was given IV fluids.  He is not tachycardic or septic appearing He has no meningeal signs to suggest meningitis. Due to concern for possible seizure activity, patient would not be able to work until seen by neurology.  Urgent referral placed for out patient neurology.  Patient advised no driving or bathing alone.  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  The differential diagnosis includes seizures, head injury, ICH   Lab Tests:   I Ordered, reviewed, and interpreted labs, which included electrolytes, complete blood count, alcohol, drug screen, Tylenol level, salicylate level  Medicines ordered:   I ordered medication IV fluids for dehydration  Imaging Studies ordered:   I ordered imaging studies which included CT head   I independently visualized and interpreted imaging which showed no acute findings    Reevaluation:  After the interventions stated above, I reevaluated the patient and found patient is improved  Final Clinical Impression(s) / ED Diagnoses Final diagnoses:  Transient alteration of awareness  Dehydration    Rx / DC Orders ED Discharge Orders    None       Ripley Fraise, MD 03/06/20 [phone]

## 2020-03-06 NOTE — ED Notes (Signed)
Dr. Bebe Shaggy notified of pt lactic acid.

## 2020-03-06 NOTE — ED Notes (Signed)
Charge RN notified that Dr. Bebe Shaggy wants pt in a room.

## 2020-03-06 NOTE — Discharge Instructions (Addendum)
Please be aware you may have another seizure  Do not drive until seen by your physician for your condition  Do not climb ladders/roofs/trees as a seizure can occur at that height and cause serious harm  Do not bathe/swim alone as a seizure can occur and cause serious harm  Please followup with your physician or neurologist for further testing and possible treatment   RETURN IMMEDIATELY IF  you develop new shortness of breath, chest pain, fever, have difficulty moving parts of your body (new weakness, numbness, or incoordination), sudden change in speech, vision, swallowing, or understanding, faint or develop new dizziness, severe headache, become poorly responsive or have an altered mental status compared to baseline for you, new rash, abdominal pain, or bloody stools,  Return sooner also if you develop new problems for which you have not talked to your caregiver but you feel may be emergency medical conditions.

## 2020-03-07 ENCOUNTER — Telehealth: Payer: Self-pay | Admitting: *Deleted

## 2020-03-07 NOTE — Telephone Encounter (Signed)
Pt mom called regarding Rx from ED visit.  RNCM reviewed chart and did not find evidence of Rx written.  Advised mother of findings.

## 2020-10-26 ENCOUNTER — Emergency Department (HOSPITAL_COMMUNITY)
Admission: EM | Admit: 2020-10-26 | Discharge: 2020-10-26 | Disposition: A | Discharge status: Home / Self Care | Payer: PRIVATE HEALTH INSURANCE | Attending: Emergency Medicine | Admitting: Emergency Medicine

## 2020-10-26 ENCOUNTER — Other Ambulatory Visit: Payer: Self-pay

## 2020-10-26 DIAGNOSIS — L02214 Cutaneous abscess of groin: Secondary | ICD-10-CM | POA: Diagnosis not present

## 2020-10-26 DIAGNOSIS — F17200 Nicotine dependence, unspecified, uncomplicated: Secondary | ICD-10-CM | POA: Diagnosis not present

## 2020-10-26 MED ORDER — LIDOCAINE-EPINEPHRINE (PF) 2 %-1:200000 IJ SOLN
10.0000 mL | Freq: Once | INTRAMUSCULAR | Status: AC
  Administered 2020-10-26: 10 mL
  Filled 2020-10-26: qty 10

## 2020-10-26 MED ORDER — DOXYCYCLINE HYCLATE 100 MG PO CAPS
100.0000 mg | ORAL_CAPSULE | Freq: Two times a day (BID) | ORAL | 0 refills | Status: DC
Start: 2020-10-26 — End: 2022-02-23

## 2020-10-26 NOTE — ED Provider Notes (Signed)
[facility] EMERGENCY DEPARTMENT Provider Note   CSN: [id number] Arrival date & time: 10/26/20  6294     History Chief Complaint  Patient presents with  . Abscess    John Krueger is a 29 y.o. male.  John Krueger is a 29 y.o. male who is otherwise healthy, presents to the ED for evaluation of possible abscess to the left groin.  He reports this area has been present for 2 days.  It has become increasingly large and very painful.  He reports pain with sitting and walking.  Abscess is present in the fold between the groin and left thigh.  No drainage from the area.  Has had some chills but no documented fevers.  Denies prior history of similar abscesses.  No history of diabetes.  He does report that he shaves in this area typically.  No penile pain or discharge, no dysuria or urinary frequency.  No scrotal pain or swelling.  No other aggravating or alleviating factors.  The history is provided by the patient.       No past medical history on file.  There are no problems to display for this patient.   No past surgical history on file.     No family history on file.  Social History   Tobacco Use  . Smoking status: Current Every Day Smoker  . Smokeless tobacco: Never Used  Substance Use Topics  . Alcohol use: No  . Drug use: No    Home Medications Prior to Admission medications   Medication Sig Start Date End Date Taking? Authorizing Provider  doxycycline (VIBRAMYCIN) 100 MG capsule Take 1 capsule (100 mg total) by mouth 2 (two) times daily. One po bid x 7 days 10/26/20  Yes Dartha Lodge, PA-C  tetrahydrozoline 0.05 % ophthalmic solution Place 2 drops into both eyes daily as needed (Dry eyes).    [provider]    Allergies    Patient has no known allergies.  Review of Systems   Review of Systems  Constitutional: Negative for chills and fever.  Gastrointestinal: Negative for abdominal pain and rectal pain.  Genitourinary: Negative for  dysuria, genital sores, penile pain, penile swelling, scrotal swelling and testicular pain.  Skin:       Abscess    Physical Exam Updated Vital Signs BP 125/76 (BP Location: Right Arm)   Pulse 99   Temp 98.8 F (37.1 C) (Oral)   Resp 17   Ht 5\' 6"  (1.676 m)   SpO2 99%   BMI 28.25 kg/m   Physical Exam Vitals and nursing note reviewed. Exam conducted with a chaperone present.  Constitutional:      General: He is not in acute distress.    Appearance: Normal appearance. He is well-developed and well-nourished. He is not diaphoretic.  HENT:     Head: Normocephalic and atraumatic.  Eyes:     General:        Right eye: No discharge.        Left eye: No discharge.  Pulmonary:     Effort: Pulmonary effort is normal. No respiratory distress.  Genitourinary:    Comments: 3 x 3 cm area of fluctuance present in the left groin adjacent to the scrotum but not extending to or involving the scrotum, mild surrounding erythema, no expressible drainage, no palpable lymphadenopathy.  Penis normal, scrotum and testes nontender without masses or swelling. Skin:    General: Skin is warm and dry.  Neurological:     Mental  Status: He is alert and oriented to person, place, and time.     Coordination: Coordination normal.  Psychiatric:        Mood and Affect: Mood and affect and mood normal.        Behavior: Behavior normal.     ED Results / Procedures / Treatments   Labs (all labs ordered are listed, but only abnormal results are displayed) Labs Reviewed - No data to display  EKG None  Radiology No results found.  Procedures .Marland KitchenIncision and Drainage  Date/Time: 10/26/2020 1:21 PM Performed by: Dartha Lodge, PA-C Authorized by: Dartha Lodge, PA-C   Consent:    Consent obtained:  Verbal   Consent given by:  Patient   Risks, benefits, and alternatives were discussed: yes     Risks discussed:  Bleeding, incomplete drainage, pain, infection and damage to other organs    Alternatives discussed:  No treatment Universal protocol:    Procedure explained and questions answered to patient or proxy's satisfaction: yes     Patient identity confirmed:  Verbally with patient Location:    Type:  Abscess   Size:  3 x 3 cm   Location: left groin. Pre-procedure details:    Skin preparation:  Chlorhexidine Sedation:    Sedation type:  None Anesthesia:    Anesthesia method:  Local infiltration   Local anesthetic:  Lidocaine 2% WITH epi Procedure type:    Complexity:  Complex Procedure details:    Incision types:  Single straight   Incision depth:  Dermal   Wound management:  Probed and deloculated   Drainage:  Bloody and purulent   Drainage amount:  Copious   Wound treatment:  Wound left open   Packing materials:  1/4 in iodoform gauze Post-procedure details:    Procedure completion:  Tolerated well, no immediate complications   (including critical care time)  Medications Ordered in ED Medications  lidocaine-EPINEPHrine (XYLOCAINE W/EPI) 2 %-1:200000 (PF) injection 10 mL (10 mLs Infiltration Given by Other 10/26/20 1233)    ED Course  I have reviewed the triage vital signs and the nursing notes.  Pertinent labs & imaging results that were available during my care of the patient were reviewed by me and considered in my medical decision making (see chart for details).    MDM Rules/Calculators/A&P                          Patient presents to the ED with abscess  To the left groinamenable to I&D.  Abscess is adjacent to the scrotum but does not involve or extend to the scrotum procedure per note above.  Due to location of abscess within the groin packing placed. Given mild surrounding cellulitis will start patient on Doxycycline. Recommended application of warm compresses/soaks/flushing. Will have patient return for wound recheck in 2 days. I discussed treatment plan, need for follow-up, and return precautions with the patient. Provided opportunity for  questions, patient confirmed understanding and is in agreement with plan.   Final Clinical Impression(s) / ED Diagnoses Final diagnoses:  Groin abscess    Rx / DC Orders ED Discharge Orders         Ordered    doxycycline (VIBRAMYCIN) 100 MG capsule  2 times daily        10/26/20 1316           Dartha Lodge, [place] 10/26/20 1328    Arby Barrette, MD 10/27/20 1527

## 2020-10-26 NOTE — ED Notes (Signed)
Reviewed discharge instructions with patient. Follow-up care and medications reviewed. Patient  verbalized understanding. Patient A&Ox4, VSS, and ambulatory with steady gait upon discharge.  °

## 2020-10-26 NOTE — ED Triage Notes (Signed)
Pt reports boil to L groin area x 2 days. Not draining. Denies fevers/chills.

## 2020-10-26 NOTE — ED Notes (Signed)
ABD pad applied to pt incision per Provider

## 2020-10-26 NOTE — ED Notes (Signed)
Called pharmacy to have lido w/ epi verified

## 2020-10-26 NOTE — Discharge Instructions (Addendum)
You were seen in the emergency department for a skin abscess- please see the attached handout for further information regarding this diagnoses. This area was incised and drained to help release the bacteria. We would like you to apply warm compresses and warm flushes to this area 4-5 times per day to help facilitate further draining as needed. We are also starting you on doxycycline, an antibiotic, in order to help treat the infection.   We have prescribed you new medication(s) today. Discuss the medications prescribed today with your pharmacist as they can have adverse effects and interactions with your other medicines including over the counter and prescribed medications. Seek medical evaluation if you start to experience new or abnormal symptoms after taking one of these medicines, seek care immediately if you start to experience difficulty breathing, feeling of your throat closing, facial swelling, or rash as these could be indications of a more serious allergic reaction  We would like you to have this area rechecked within 48 hours- please return to the ER , go to an urgent care, or see your primary care provider for this.  Packing was placed and may fall out on its own but if not you will need to be removed in 48 hours.  Return to the ER sooner for new or worsening symptoms including, but not limited to increased pain, spreading redness, fevers, inability to keep fluids down, or any other concerns that you may have.

## 2021-03-12 ENCOUNTER — Emergency Department (HOSPITAL_COMMUNITY): Payer: Self-pay

## 2021-03-12 ENCOUNTER — Encounter (HOSPITAL_COMMUNITY): Payer: Self-pay | Admitting: Emergency Medicine

## 2021-03-12 ENCOUNTER — Emergency Department (HOSPITAL_COMMUNITY)
Admission: EM | Admit: 2021-03-12 | Discharge: 2021-03-12 | Disposition: A | Discharge status: Home / Self Care | Payer: Self-pay | Attending: Emergency Medicine | Admitting: Emergency Medicine

## 2021-03-12 ENCOUNTER — Other Ambulatory Visit: Payer: Self-pay

## 2021-03-12 DIAGNOSIS — F17200 Nicotine dependence, unspecified, uncomplicated: Secondary | ICD-10-CM | POA: Insufficient documentation

## 2021-03-12 DIAGNOSIS — Z79899 Other long term (current) drug therapy: Secondary | ICD-10-CM | POA: Insufficient documentation

## 2021-03-12 DIAGNOSIS — R569 Unspecified convulsions: Secondary | ICD-10-CM | POA: Insufficient documentation

## 2021-03-12 LAB — RAPID URINE DRUG SCREEN, HOSP PERFORMED
Amphetamines: NOT DETECTED
Barbiturates: NOT DETECTED
Benzodiazepines: NOT DETECTED
Cocaine: NOT DETECTED
Opiates: NOT DETECTED
Tetrahydrocannabinol: NOT DETECTED

## 2021-03-12 LAB — BASIC METABOLIC PANEL
Anion gap: 15 (ref 5–15)
BUN: 10 mg/dL (ref 6–20)
CO2: 24 mmol/L (ref 22–32)
Calcium: 9.7 mg/dL (ref 8.9–10.3)
Chloride: 92 mmol/L — ABNORMAL LOW (ref 98–111)
Creatinine, Ser: 1.03 mg/dL (ref 0.61–1.24)
GFR, Estimated: >60 mL/min (ref >60)
Glucose, Bld: 141 mg/dL — ABNORMAL HIGH (ref 70–99)
Potassium: 3.4 mmol/L — ABNORMAL LOW (ref 3.5–5.1)
Sodium: 131 mmol/L — ABNORMAL LOW (ref 135–145)

## 2021-03-12 LAB — CBC WITH DIFFERENTIAL/PLATELET
Abs Immature Granulocytes: 0.02 10*3/uL (ref 0.00–0.07)
Basophils Absolute: 0 10*3/uL (ref 0.0–0.1)
Basophils Relative: 0 %
Eosinophils Absolute: 0 10*3/uL (ref 0.0–0.5)
Eosinophils Relative: 1 %
HCT: 40.2 % (ref 39.0–52.0)
Hemoglobin: 13.6 g/dL (ref 13.0–17.0)
Immature Granulocytes: 0 %
Lymphocytes Relative: 29 %
Lymphs Abs: 1.4 10*3/uL (ref 0.7–4.0)
MCH: 30 pg (ref 26.0–34.0)
MCHC: 33.8 g/dL (ref 30.0–36.0)
MCV: 88.5 fL (ref 80.0–100.0)
Monocytes Absolute: 0.6 10*3/uL (ref 0.1–1.0)
Monocytes Relative: 13 %
Neutro Abs: 2.8 10*3/uL (ref 1.7–7.7)
Neutrophils Relative %: 57 %
Platelets: 147 10*3/uL — ABNORMAL LOW (ref 150–400)
RBC: 4.54 MIL/uL (ref 4.22–5.81)
RDW: 16.3 % — ABNORMAL HIGH (ref 11.5–15.5)
WBC: 4.9 10*3/uL (ref 4.0–10.5)
nRBC: 0 % (ref 0.0–0.2)

## 2021-03-12 LAB — CBG MONITORING, ED: Glucose-Capillary: 150 mg/dL — ABNORMAL HIGH (ref 70–99)

## 2021-03-12 LAB — URINALYSIS, ROUTINE W REFLEX MICROSCOPIC
Bilirubin Urine: NEGATIVE
Glucose, UA: NEGATIVE mg/dL
Hgb urine dipstick: NEGATIVE
Ketones, ur: NEGATIVE mg/dL
Leukocytes,Ua: NEGATIVE
Nitrite: NEGATIVE
Protein, ur: NEGATIVE mg/dL
Specific Gravity, Urine: 1.006 (ref 1.005–1.030)
pH: 7 (ref 5.0–8.0)

## 2021-03-12 LAB — ETHANOL: Alcohol, Ethyl (B): <10 mg/dL (ref <10)

## 2021-03-12 LAB — MAGNESIUM: Magnesium: 2.1 mg/dL (ref 1.7–2.4)

## 2021-03-12 MED ORDER — LEVETIRACETAM 500 MG PO TABS: 500.0000 mg | ORAL_TABLET | Freq: Two times a day (BID) | ORAL | 0 refills | Status: DC

## 2021-03-12 MED ORDER — LEVETIRACETAM IN NACL 1000 MG/100ML IV SOLN
1000.0000 mg | Freq: Once | INTRAVENOUS | Status: AC
  Administered 2021-03-12: 1000 mg via INTRAVENOUS
  Filled 2021-03-12: qty 100

## 2021-03-12 NOTE — ED Notes (Signed)
Pt resting at this time. Visible chest rise and fall, appears in NAD.

## 2021-03-12 NOTE — Discharge Instructions (Addendum)
  You were evaluated in the Emergency Department and after careful evaluation, we did not find any emergent condition requiring admission or further testing in the hospital.   Your exam/testing today was overall reassuring.  Symptoms seem to be due to seizures.  Which you start taking the medication twice daily if prescribed.  I also want to follow-up with neurology soon as possible, please schedule appointment with them.  Please do not drive, swim, operate machinery, be in the shower unattended until cleared by neurology.  If you have any new worsening concerning symptom please come back to the ER.  Your electrolyteswere a little off, I want you to eat a salty meal when you get home, make sure to drink plenty of fluids and have these rechecked by your PCP in the next couple days.  Your platelets were also low, have these rechecked as well. Please return to the Emergency Department if you experience any worsening of your condition.  Thank you for allowing [place] to be a part of your care. Please speak to your pharmacist about any new medications prescribed today in regards to side effects or interactions with other medications.     Get help right away if: You have any of these problems: A seizure that lasts longer than 5 minutes. Many seizures in a row and you do not feel better between seizures. A seizure that makes it harder to breathe. A seizure and you can no longer speak or use part of your body. You do not wake up right after a seizure. You get hurt during a seizure. You feel confused or have pain right after a seizure.

## 2021-03-12 NOTE — ED Triage Notes (Signed)
[place] [facility] [place]. Pt was on bench next to attorney, per attorney, pt had 20 second seizure, appeared to be tonic clonic. Pt has never been diagnosed with seizures, but endorses seizure in February, was not seen for episode. Pt appeared postictal initially. Pt was not given meds with EMS.   EMS VS- 144/100, HR 110, CBG 155

## 2021-03-12 NOTE — ED Provider Notes (Signed)
[name] EMERGENCY DEPARTMENT Provider Note   CSN: [id number] Arrival date & time: 03/12/21  1007     History Chief Complaint  Patient presents with  . Seizures    John Krueger is a 30 y.o. male with no pertinent past medical history that presents to the emerge department today via EMS for seizure.  Patient states that he felt fine this morning, was at the court house, on the bench next to their attorney when patient had a seizure according to EMS per attorney.  Seizure lasted 20 seconds, tonic-clonic, postictal afterward.  Postictal for EMS, unsure how long for.  Was not given any medications.  Patient states that he feels fine now.  States he was upright in chair, patient did not fall or hit his head. Does not remember event.  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  Patient states that luckily someone was able to help him out of the forklift when it occurred.  Denies any alcohol or substance use.  Patient does not have a family history of seizures.  Denies headache, vision changes, neck pain, weakness.  Denies any chest pain or shortness of breath.  Denies any fevers.  Patient did not urinate on himself, no oral lesions.  No other complaints at this time.  HPI     History reviewed. No pertinent past medical history.  There are no problems to display for this patient.   History reviewed. No pertinent surgical history.     No family history on file.  Social History   Tobacco Use  . Smoking status: Current Every Day Smoker  . Smokeless tobacco: Never Used  Substance Use Topics  . Alcohol use: No  . Drug use: No    Home Medications Prior to Admission medications   Medication Sig Start Date End Date Taking? Authorizing Provider  levETIRAcetam (KEPPRA) 500 MG tablet Take 1 tablet (500 mg total) by mouth 2 (two) times daily. 03/12/21  04/11/21 Yes Spencer Peterkin, PA-C  doxycycline (VIBRAMYCIN) 100 MG capsule Take 1 capsule (100 mg total) by mouth 2 (two) times daily. One po bid x 7 days 10/26/20   Dartha Lodge, PA-C  tetrahydrozoline 0.05 % ophthalmic solution Place 2 drops into both eyes daily as needed (Dry eyes).    [provider]    Allergies    Patient has no known allergies.  Review of Systems   Review of Systems  Constitutional: Negative for chills, diaphoresis, fatigue and fever.  HENT: Negative for congestion, sore throat and trouble swallowing.   Eyes: Negative for pain and visual disturbance.  Respiratory: Negative for cough, shortness of breath and wheezing.   Cardiovascular: Negative for chest pain, palpitations and leg swelling.  Gastrointestinal: Negative for abdominal distention, abdominal pain, diarrhea, nausea and vomiting.  Genitourinary: Negative for difficulty urinating.  Musculoskeletal: Negative for back pain, neck pain and neck stiffness.  Skin: Negative for pallor.  Neurological: Positive for seizures. Negative for dizziness, syncope, speech difficulty, weakness, light-headedness and headaches.  Psychiatric/Behavioral: Negative for confusion.    Physical Exam Updated Vital Signs BP (!) 122/92   Pulse 73   Temp 99.3 F (37.4 C) (Oral)   Resp 16   SpO2 100%   Physical Exam Constitutional:      General: He is not in acute distress.    Appearance: Normal appearance. He is not ill-appearing,  toxic-appearing or diaphoretic.  HENT:     Mouth/Throat:     Mouth: Mucous membranes are moist.     Pharynx: Oropharynx is clear.     Comments: No lesions in mouth Eyes:     General: No scleral icterus.    Extraocular Movements: Extraocular movements intact.     Pupils: Pupils are equal, round, and reactive to light.  Cardiovascular:     Rate and Rhythm: Normal rate and regular rhythm.     Pulses: Normal pulses.     Heart sounds: Normal heart sounds.  Pulmonary:     Effort:  Pulmonary effort is normal. No respiratory distress.     Breath sounds: Normal breath sounds. No stridor. No wheezing, rhonchi or rales.  Chest:     Chest wall: No tenderness.  Abdominal:     General: Abdomen is flat. There is no distension.     Palpations: Abdomen is soft.     Tenderness: There is no abdominal tenderness. There is no guarding or rebound.  Musculoskeletal:        General: No swelling or tenderness. Normal range of motion.     Cervical back: Normal range of motion and neck supple. No rigidity.     Right lower leg: No edema.     Left lower leg: No edema.  Skin:    General: Skin is warm and dry.     Capillary Refill: Capillary refill takes less than 2 seconds.     Coloration: Skin is not pale.  Neurological:     General: No focal deficit present.     Mental Status: He is alert and oriented to person, place, and time.     Comments: Alert. Clear speech. No facial droop. CNIII-XII grossly intact. Bilateral upper and lower extremities' sensation grossly intact. 5/5 symmetric strength with grip strength and with plantar and dorsi flexion bilaterally. Normal finger to nose bilaterally. Negative pronator drift. Negative Romberg sign. Gait is steady and intact    Psychiatric:        Mood and Affect: Mood normal.        Behavior: Behavior normal.     ED Results / Procedures / Treatments   Labs (all labs ordered are listed, but only abnormal results are displayed) Labs Reviewed  BASIC METABOLIC PANEL - Abnormal; Notable for the following components:      Result Value   Sodium 131 (*)    Potassium 3.4 (*)    Chloride 92 (*)    Glucose, Bld 141 (*)    All other components within normal limits  CBC WITH DIFFERENTIAL/PLATELET - Abnormal; Notable for the following components:   RDW 16.3 (*)    Platelets 147 (*)    All other components within normal limits  CBG MONITORING, ED - Abnormal; Notable for the following components:   Glucose-Capillary 150 (*)    All other  components within normal limits  MAGNESIUM  RAPID URINE DRUG SCREEN, HOSP PERFORMED  URINALYSIS, ROUTINE W REFLEX MICROSCOPIC  ETHANOL    EKG None  Radiology CT Head Wo Contrast  Result Date: 03/12/2021 CLINICAL DATA:  Seizure, 03/06/2020 EXAM: CT HEAD WITHOUT CONTRAST TECHNIQUE: Contiguous axial images were obtained from the base of the skull through the vertex without intravenous contrast. COMPARISON:  None. FINDINGS: Brain: No evidence of acute infarction, hemorrhage, hydrocephalus, extra-axial collection or mass lesion/mass effect. Vascular: No hyperdense vessel or unexpected calcification. Skull: Normal. Negative for fracture or focal lesion. Sinuses/Orbits: No acute finding. Other: None. IMPRESSION: No acute intracranial  pathology. Electronically Signed   By: Lauralyn Primes M.D.   On: 03/12/2021 12:49    Procedures Procedures   Medications Ordered in ED Medications  levETIRAcetam (KEPPRA) IVPB 1000 mg/100 mL premix (0 mg Intravenous Stopped 03/12/21 1415)    ED Course  I have reviewed the triage vital signs and the nursing notes.  Pertinent labs & imaging results that were available during my care of the patient were reviewed by me and considered in my medical decision making (see chart for details).    MDM Rules/Calculators/A&P                         John Krueger is a 30 y.o. male with no pertinent past medical history that presents to the emerge department today via EMS for seizure.  Allegedly witnessed seizure, lasted 20 seconds with postictal period.  Patient with normal neuro exam here today, denies any substance use.  Patient is 30 year old with 2 seizures in the month, will obtain basic lab work and CT head.  Patient was not seen for last seizure.  Patient loaded with Keppra.  Work-up today unremarkable, CBC unremarkable.  BMP with some derangements including sodium 131 and chloride of 92.  Patient is eating a salty meal and drinking plenty of fluids at this time, do not  think that he needs IV fluids currently.  Patient will have these rechecked with his PCP in the next couple of days.  Platelets 147, this appears to be chronic for patient.  Ethanol and urine rapid drug screen negative.  CT head without any acute intracranial abnormality.  We will treat for seizure disorder, symptomatic treatment discussed and strict return precautions given.  Patient education discussed about seizure precautions given.  Doubt need for further emergent work up at this time. I explained the diagnosis and have given explicit precautions to return to the ER including for any other new or worsening symptoms. The patient understands and accepts the medical plan as it's been dictated and I have answered their questions. Discharge instructions concerning home care and prescriptions have been given. The patient is STABLE and is discharged to home in good condition.  I discussed this case with my attending physician who cosigned this note including patient's presenting symptoms, physical exam, and planned diagnostics and interventions. Attending physician stated agreement with plan or made changes to plan which were implemented.   Attending physician assessed patient at bedside.  Final Clinical Impression(s) / ED Diagnoses Final diagnoses:  Seizure (HCC)    Rx / DC Orders ED Discharge Orders         Ordered    levETIRAcetam (KEPPRA) 500 MG tablet  2 times daily        03/12/21 1413    Ambulatory referral to Neurology       Comments: An appointment is requested in approximately:1 week   03/12/21 1415           Farrel Gordon, PA-C 03/12/21 1516    Gerhard Munch, MD 03/16/21 1249

## 2021-03-19 ENCOUNTER — Encounter: Payer: Self-pay | Admitting: Neurology

## 2021-03-19 ENCOUNTER — Ambulatory Visit: Payer: PRIVATE HEALTH INSURANCE | Admitting: Neurology

## 2021-03-23 ENCOUNTER — Other Ambulatory Visit: Payer: Self-pay

## 2021-03-23 ENCOUNTER — Encounter (HOSPITAL_COMMUNITY): Payer: Self-pay | Admitting: Emergency Medicine

## 2021-03-23 ENCOUNTER — Emergency Department (HOSPITAL_COMMUNITY)
Admission: EM | Admit: 2021-03-23 | Discharge: 2021-03-23 | Disposition: A | Discharge status: Home / Self Care | Payer: PRIVATE HEALTH INSURANCE | Attending: Emergency Medicine | Admitting: Emergency Medicine

## 2021-03-23 DIAGNOSIS — W19XXXA Unspecified fall, initial encounter: Secondary | ICD-10-CM | POA: Insufficient documentation

## 2021-03-23 DIAGNOSIS — R748 Abnormal levels of other serum enzymes: Secondary | ICD-10-CM

## 2021-03-23 DIAGNOSIS — M79601 Pain in right arm: Secondary | ICD-10-CM | POA: Insufficient documentation

## 2021-03-23 DIAGNOSIS — M25552 Pain in left hip: Secondary | ICD-10-CM | POA: Insufficient documentation

## 2021-03-23 DIAGNOSIS — S39012A Strain of muscle, fascia and tendon of lower back, initial encounter: Secondary | ICD-10-CM | POA: Insufficient documentation

## 2021-03-23 DIAGNOSIS — M79602 Pain in left arm: Secondary | ICD-10-CM | POA: Insufficient documentation

## 2021-03-23 DIAGNOSIS — F17200 Nicotine dependence, unspecified, uncomplicated: Secondary | ICD-10-CM | POA: Insufficient documentation

## 2021-03-23 DIAGNOSIS — M2550 Pain in unspecified joint: Secondary | ICD-10-CM

## 2021-03-23 DIAGNOSIS — Y990 Civilian activity done for income or pay: Secondary | ICD-10-CM | POA: Insufficient documentation

## 2021-03-23 DIAGNOSIS — M25551 Pain in right hip: Secondary | ICD-10-CM | POA: Insufficient documentation

## 2021-03-23 HISTORY — DX: Unspecified convulsions: R56.9

## 2021-03-23 LAB — COMPREHENSIVE METABOLIC PANEL
ALT: 40 U/L (ref 0–44)
AST: 88 U/L — ABNORMAL HIGH (ref 15–41)
Albumin: 4.1 g/dL (ref 3.5–5.0)
Alkaline Phosphatase: 78 U/L (ref 38–126)
Anion gap: 10 (ref 5–15)
BUN: 7 mg/dL (ref 6–20)
CO2: 23 mmol/L (ref 22–32)
Calcium: 8.9 mg/dL (ref 8.9–10.3)
Chloride: 105 mmol/L (ref 98–111)
Creatinine, Ser: 0.77 mg/dL (ref 0.61–1.24)
GFR, Estimated: >60 mL/min (ref >60)
Glucose, Bld: 86 mg/dL (ref 70–99)
Potassium: 3.6 mmol/L (ref 3.5–5.1)
Sodium: 138 mmol/L (ref 135–145)
Total Bilirubin: 0.5 mg/dL (ref 0.3–1.2)
Total Protein: 7.4 g/dL (ref 6.5–8.1)

## 2021-03-23 LAB — CBC
HCT: 37.6 % — ABNORMAL LOW (ref 39.0–52.0)
Hemoglobin: 12.7 g/dL — ABNORMAL LOW (ref 13.0–17.0)
MCH: 30 pg (ref 26.0–34.0)
MCHC: 33.8 g/dL (ref 30.0–36.0)
MCV: 88.7 fL (ref 80.0–100.0)
Platelets: 250 10*3/uL (ref 150–400)
RBC: 4.24 MIL/uL (ref 4.22–5.81)
RDW: 17.3 % — ABNORMAL HIGH (ref 11.5–15.5)
WBC: 7.6 10*3/uL (ref 4.0–10.5)
nRBC: 0 % (ref 0.0–0.2)

## 2021-03-23 LAB — MAGNESIUM: Magnesium: 2.2 mg/dL (ref 1.7–2.4)

## 2021-03-23 LAB — URINALYSIS, ROUTINE W REFLEX MICROSCOPIC
Bilirubin Urine: NEGATIVE
Glucose, UA: NEGATIVE mg/dL
Hgb urine dipstick: NEGATIVE
Ketones, ur: NEGATIVE mg/dL
Leukocytes,Ua: NEGATIVE
Nitrite: NEGATIVE
Protein, ur: NEGATIVE mg/dL
Specific Gravity, Urine: 1.002 — ABNORMAL LOW (ref 1.005–1.030)
pH: 6 (ref 5.0–8.0)

## 2021-03-23 LAB — CK: Total CK: 1128 U/L — ABNORMAL HIGH (ref 49–397)

## 2021-03-23 MED ORDER — KETOROLAC TROMETHAMINE 30 MG/ML IJ SOLN
30.0000 mg | Freq: Once | INTRAMUSCULAR | Status: AC
Start: 2021-03-23 — End: 2021-03-23
  Administered 2021-03-23: 30 mg via INTRAMUSCULAR
  Filled 2021-03-23: qty 1

## 2021-03-23 NOTE — ED Notes (Signed)
Called Transition of care team

## 2021-03-23 NOTE — ED Triage Notes (Signed)
Patient fell at work this morning reports pain at right lower back radiating down to right leg , ambulatory .

## 2021-03-23 NOTE — ED Provider Notes (Signed)
Emergency Medicine Provider Triage Evaluation Note  Dearis Krueger , a 30 y.o. male  was evaluated in triage.  Pt complains of muscle cramps.   Patient reports that he has not been feeling back to his baseline since he was last seen in the emergency department on May 17.  He reports recurrent muscle cramps, particularly in his bilateral legs, arms, and even sometimes in his trunk.  He states that the other day he was actually trying to drive and it seemed as if his foot and lower leg locked up due to the cramping.  Cramps have been increasing in intensity.  He reports a frequent, associated headaches.  He notes that he is only urinated twice within the last 24 hours.  He does endorse some burning with urination.  He denies numbness, weakness, vomiting, chest pain, abdominal pain, syncope, or seizure-like activity since his last ER visit.  He does report that he is very active and plays multiple sports, including playing soccer.  He denies any decrease in p.o. intake.  Review of Systems  Positive: Muscle cramps, headache, decreased urinary output, dysuria Negative: Fever, chills, vomiting, diarrhea, seizure like activity, syncope, chest pain, abdominal pain, visual changes, rash, arthralgias  Physical Exam  BP (!) 127/91 (BP Location: Left Arm)   Pulse 81   Temp (!) 97.3 F (36.3 C) (Oral)   Resp 20   SpO2 98%  Gen:   Awake, no distress   Resp:  Normal effort  MSK:   Moves extremities without difficulty  Other:  No visible muscle spasms.  Abdomen is soft and nontender.  No focal neurologic deficits.  Tongue appears dry with white coating noted to the dorsum of the tongue.  Medical Decision Making  Medically screening exam initiated at 6:16 AM.  Appropriate orders placed.  [name] was informed that the remainder of the evaluation will be completed by another provider, this initial triage assessment does not replace that evaluation, and the importance of remaining in the ED until their evaluation  is complete.  30 year old male who presents with muscle cramps, headaches, and decreased urinary output.  Patient was recently seen for suspected seizure-like activity that occurred twice over the last month on May 17.  He was discharged with Keppra.  He has an outpatient follow-up appoint with neurology, but has not yet been seen.  The patient appears very active, and endorses playing multiple sports.  He does also state that he is only voided within the last 24 hours.  Will order labs, CK, and urinalysis since he is endorsing some dysuria.  Patient will require further work-up and evaluation in the emergency department.   Barkley Boards, PA-C 03/23/21 8850    Shon Baton, MD 03/23/21 2350

## 2021-03-23 NOTE — Discharge Instructions (Addendum)
You were seen in the emergency department for back and joint pain.  Your lab work showed your muscle enzyme to be elevated, this usually goes along with some sort of muscle trauma.  Please drink plenty of fluids.  Ibuprofen for joint pain.  You will be important for you to establish care with a primary care doctor.  Please return to the emergency department for any worsening or concerning symptoms

## 2021-03-23 NOTE — ED Provider Notes (Signed)
[facility] EMERGENCY DEPARTMENT Provider Note   CSN: [id number] Arrival date & time: 03/23/21  8315     History Chief Complaint  Patient presents with  . Back Pain    John Krueger is a 30 y.o. male.  He is here with a complaint of pain in his joints that has been going on for 9 months.  Worse with movement.  Has tried nothing for it.  Rates the pain as moderate in severity involves arms back legs hips.  Denies any trauma.  He sometimes gets headaches, sometimes cough, feels he is urinating less.  Denies any alcohol or drugs.  He was here last week after a possible seizure.  Denies any further seizure activity no known fevers.  The history is provided by the patient.       Past Medical History:  Diagnosis Date  . Seizures (HCC)     There are no problems to display for this patient.   History reviewed. No pertinent surgical history.     No family history on file.  Social History   Tobacco Use  . Smoking status: Current Every Day Smoker  . Smokeless tobacco: Never Used  Substance Use Topics  . Alcohol use: No  . Drug use: No    Home Medications Prior to Admission medications   Medication Sig Start Date End Date Taking? Authorizing Provider  doxycycline (VIBRAMYCIN) 100 MG capsule Take 1 capsule (100 mg total) by mouth 2 (two) times daily. One po bid x 7 days 10/26/20   Dartha Lodge, PA-C  levETIRAcetam (KEPPRA) 500 MG tablet Take 1 tablet (500 mg total) by mouth 2 (two) times daily. 03/12/21 04/11/21  Farrel Gordon, PA-C  tetrahydrozoline 0.05 % ophthalmic solution Place 2 drops into both eyes daily as needed (Dry eyes).    [provider]    Allergies    Patient has no known allergies.  Review of Systems   Review of Systems  Constitutional: Negative for fever.  HENT: Negative for sore throat.   Eyes: Negative for visual disturbance.  Respiratory: Negative for shortness of breath.   Cardiovascular: Negative for chest pain.   Gastrointestinal: Positive for nausea and vomiting. Negative for abdominal pain.  Genitourinary: Positive for decreased urine volume. Negative for dysuria.  Musculoskeletal: Positive for arthralgias and back pain.  Skin: Negative for rash.  Neurological: Negative for headaches.    Physical Exam Updated Vital Signs BP 122/78 (BP Location: Right Arm)   Pulse 70   Temp 97.8 F (36.6 C) (Oral)   Resp 19   Ht 5\' 5"  (1.651 m)   Wt 72.6 kg   SpO2 98%   BMI 26.63 kg/m   Physical Exam Vitals and nursing note reviewed.  Constitutional:      Appearance: Normal appearance. He is well-developed.  HENT:     Head: Normocephalic and atraumatic.  Eyes:     Conjunctiva/sclera: Conjunctivae normal.  Cardiovascular:     Rate and Rhythm: Normal rate and regular rhythm.     Heart sounds: No murmur heard.   Pulmonary:     Effort: Pulmonary effort is normal. No respiratory distress.     Breath sounds: Normal breath sounds.  Abdominal:     Palpations: Abdomen is soft.     Tenderness: There is no abdominal tenderness.  Musculoskeletal:        General: No swelling or deformity. Normal range of motion.     Cervical back: Neck supple.     Right lower leg:  No edema.     Left lower leg: No edema.     Comments: All compartments are soft.  Range of motion of joints are normal and there are no swollen or hot joints appreciated.  Skin:    General: Skin is warm and dry.     Capillary Refill: Capillary refill takes less than 2 seconds.  Neurological:     General: No focal deficit present.     Mental Status: He is alert.     ED Results / Procedures / Treatments   Labs (all labs ordered are listed, but only abnormal results are displayed) Labs Reviewed  CBC - Abnormal; Notable for the following components:      Result Value   Hemoglobin 12.7 (*)    HCT 37.6 (*)    RDW 17.3 (*)    All other components within normal limits  COMPREHENSIVE METABOLIC PANEL - Abnormal; Notable for the following  components:   AST 88 (*)    All other components within normal limits  CK - Abnormal; Notable for the following components:   Total CK 1,128 (*)    All other components within normal limits  URINALYSIS, ROUTINE W REFLEX MICROSCOPIC - Abnormal; Notable for the following components:   Color, Urine COLORLESS (*)    Specific Gravity, Urine 1.002 (*)    All other components within normal limits  MAGNESIUM    EKG None  Radiology No results found.  Procedures Procedures   Medications Ordered in ED Medications - No data to display  ED Course  I have reviewed the triage vital signs and the nursing notes.  Pertinent labs & imaging results that were available during my care of the patient were reviewed by me and considered in my medical decision making (see chart for details).    MDM Rules/Calculators/A&P                         CPK is moderately elevated.  Patient denies any trauma.  Question whether he may have had another seizure recently and has some muscle breakdown from that.  I do not see any infected joints and no obvious signs of trauma.  We will have continue to hydrate at home.  Have put in a [facility] consult to try to get the patient a primary care doctor.  Final Clinical Impression(s) / ED Diagnoses Final diagnoses:  Strain of lumbar region, initial encounter  Arthralgia, unspecified joint  Elevated CPK    Rx / DC Orders ED Discharge Orders    None       Terrilee Files, MD 03/23/21 1332

## 2021-04-26 ENCOUNTER — Other Ambulatory Visit: Payer: Self-pay

## 2021-04-26 ENCOUNTER — Emergency Department (HOSPITAL_COMMUNITY)
Admission: EM | Admit: 2021-04-26 | Discharge: 2021-04-27 | Disposition: A | Discharge status: Home / Self Care | Payer: PRIVATE HEALTH INSURANCE | Attending: Emergency Medicine | Admitting: Emergency Medicine

## 2021-04-26 ENCOUNTER — Encounter (HOSPITAL_COMMUNITY): Payer: Self-pay | Admitting: *Deleted

## 2021-04-26 DIAGNOSIS — M549 Dorsalgia, unspecified: Secondary | ICD-10-CM | POA: Insufficient documentation

## 2021-04-26 DIAGNOSIS — R569 Unspecified convulsions: Secondary | ICD-10-CM | POA: Insufficient documentation

## 2021-04-26 DIAGNOSIS — Z5321 Procedure and treatment not carried out due to patient leaving prior to being seen by health care provider: Secondary | ICD-10-CM | POA: Insufficient documentation

## 2021-04-27 LAB — COMPREHENSIVE METABOLIC PANEL
ALT: 21 U/L (ref 0–44)
AST: 37 U/L (ref 15–41)
Albumin: 3.9 g/dL (ref 3.5–5.0)
Alkaline Phosphatase: 76 U/L (ref 38–126)
Anion gap: 10 (ref 5–15)
BUN: 7 mg/dL (ref 6–20)
CO2: 25 mmol/L (ref 22–32)
Calcium: 9 mg/dL (ref 8.9–10.3)
Chloride: 104 mmol/L (ref 98–111)
Creatinine, Ser: 0.92 mg/dL (ref 0.61–1.24)
GFR, Estimated: >60 mL/min (ref >60)
Glucose, Bld: 84 mg/dL (ref 70–99)
Potassium: 3.7 mmol/L (ref 3.5–5.1)
Sodium: 139 mmol/L (ref 135–145)
Total Bilirubin: 0.4 mg/dL (ref 0.3–1.2)
Total Protein: 7.3 g/dL (ref 6.5–8.1)

## 2021-04-27 LAB — CBC
HCT: 37.2 % — ABNORMAL LOW (ref 39.0–52.0)
Hemoglobin: 12.7 g/dL — ABNORMAL LOW (ref 13.0–17.0)
MCH: 31.7 pg (ref 26.0–34.0)
MCHC: 34.1 g/dL (ref 30.0–36.0)
MCV: 92.8 fL (ref 80.0–100.0)
Platelets: 126 10*3/uL — ABNORMAL LOW (ref 150–400)
RBC: 4.01 MIL/uL — ABNORMAL LOW (ref 4.22–5.81)
RDW: 17.3 % — ABNORMAL HIGH (ref 11.5–15.5)
WBC: 6 10*3/uL (ref 4.0–10.5)
nRBC: 0 % (ref 0.0–0.2)

## 2021-04-27 NOTE — ED Notes (Signed)
Pt left AMA °

## 2021-05-17 IMAGING — CT CT HEAD W/O CM
4 series · 17 of 47 positions shown, 19 images · non-contrast
Comparison: None.

CLINICAL DATA: Seizure, 03/06/2020

EXAM:
CT HEAD WITHOUT CONTRAST
TECHNIQUE: Contiguous axial images were obtained from the base of the skull
through the vertex without intravenous contrast.

[Series 3: head without · axial · non-contrast · 0.47mm/px · z∈[-167,-32]mm · 7 of 37 slices shown, 9 images]
[im 5/37  brain]
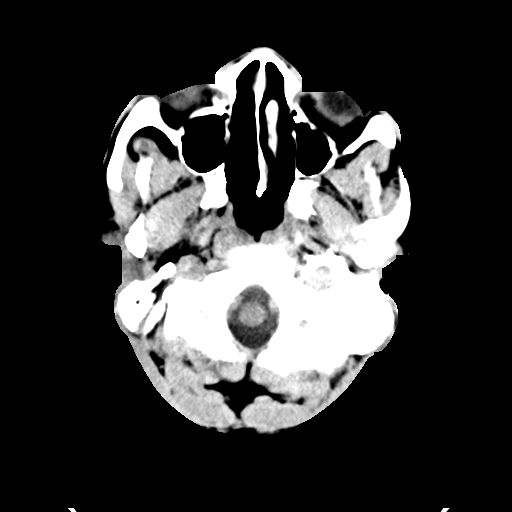
[im 5/37  bone]
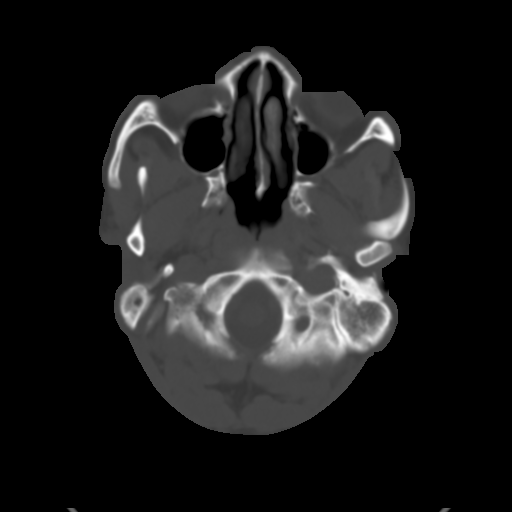
[im 10/37  brain]
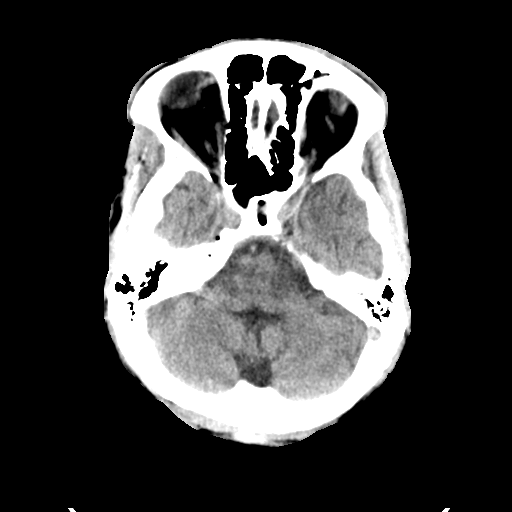
[im 14/37  brain]
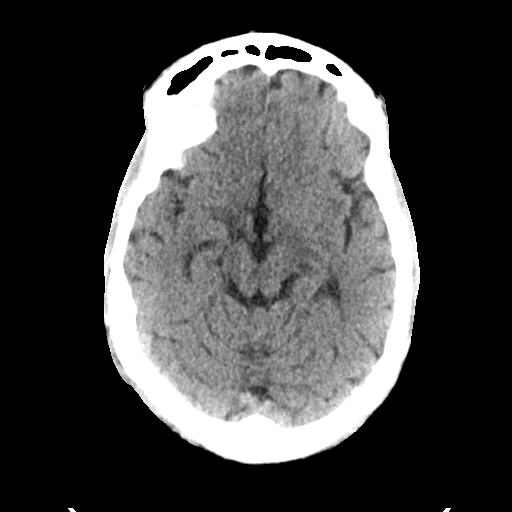
[im 19/37  brain]
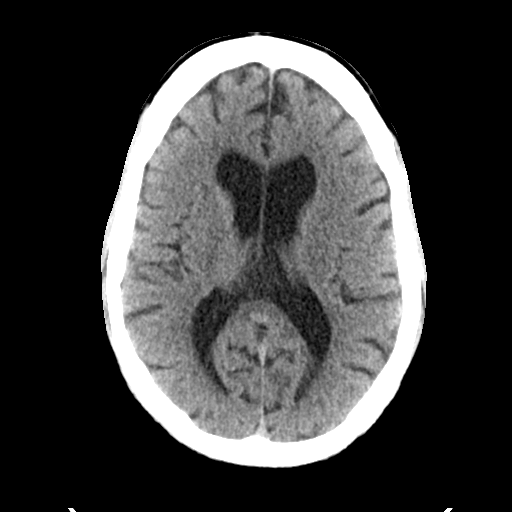
[im 23/37  brain]
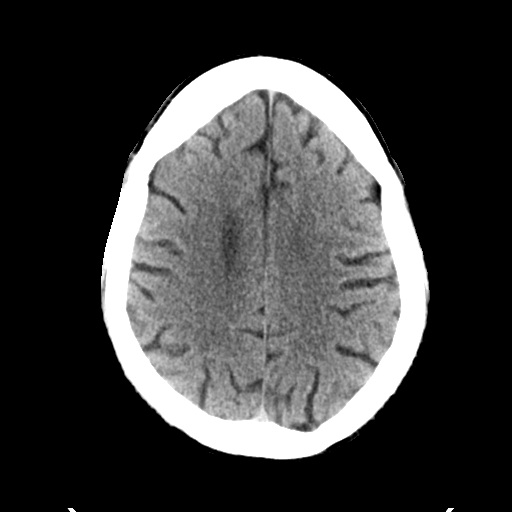
[im 23/37  bone]
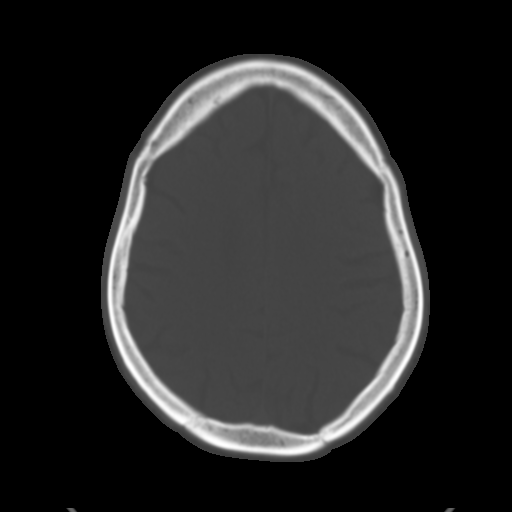
[im 28/37  brain]
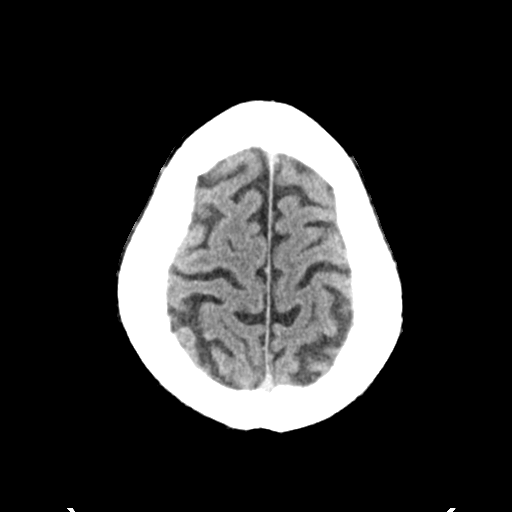
[im 32/37  brain]
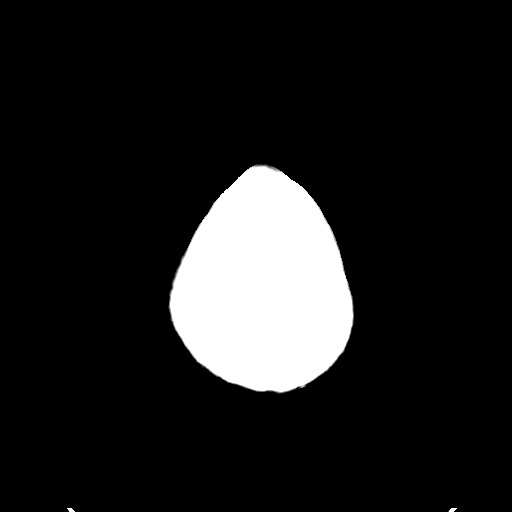

[Series 4: head bone · axial · 0.47mm/px · z∈[-169,-107]mm · 4 of 91 slices shown]
[im 10/91  bone]
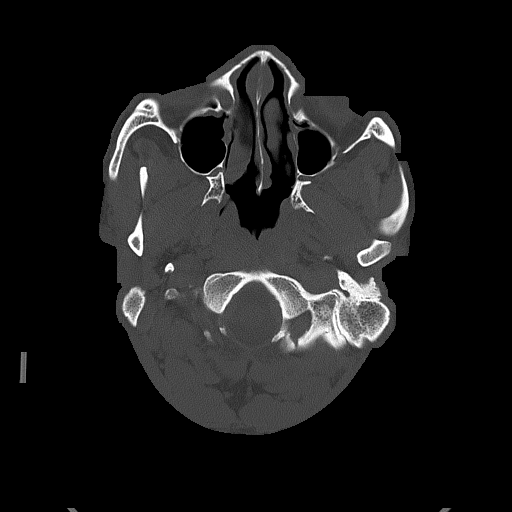
[im 19/91  bone]
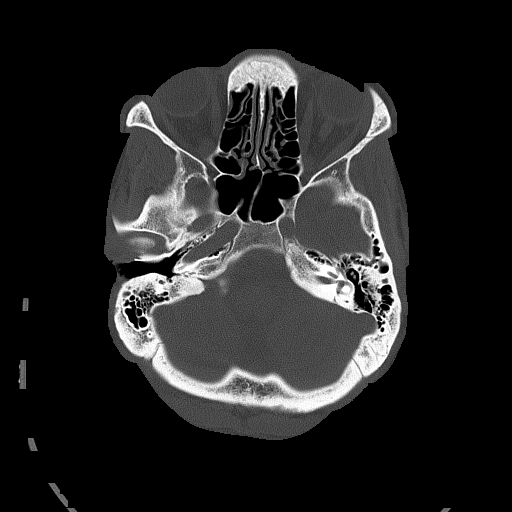
[im 28/91  bone]
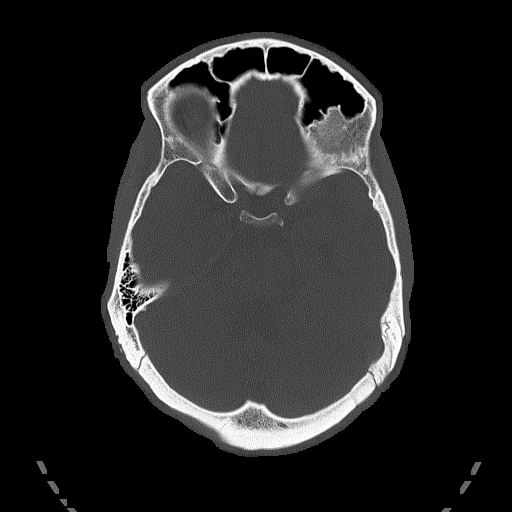
[im 41/91  bone]
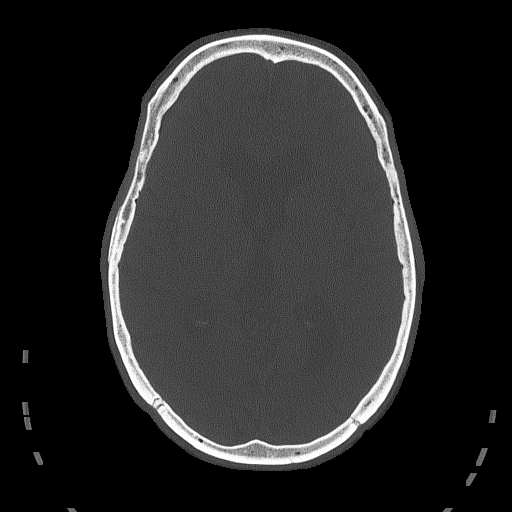

[Series 5: head without cor · coronal · non-contrast · 0.36mm/px · 3 of 78 slices shown]
[im 26/78  brain]
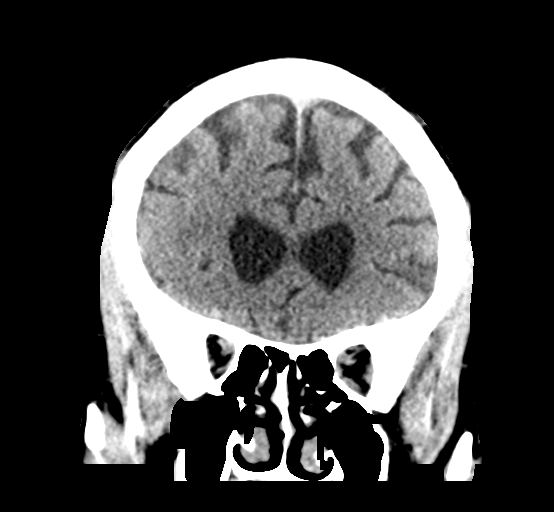
[im 35/78  brain]
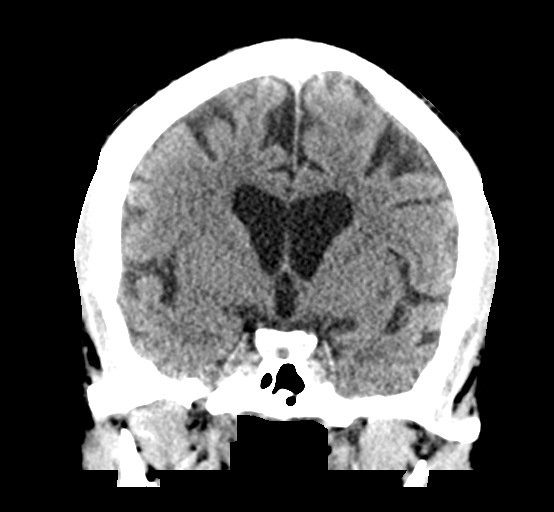
[im 43/78  brain]
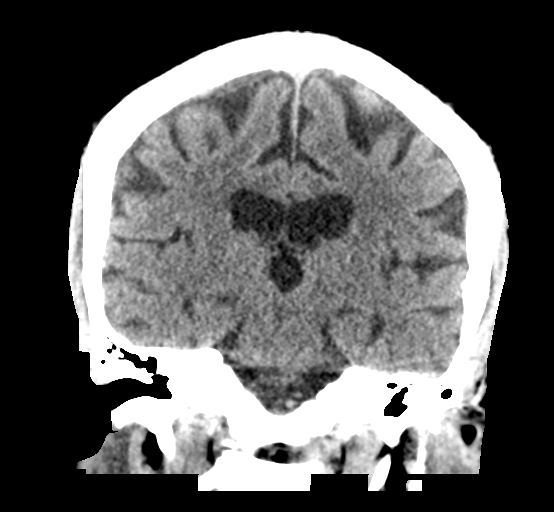

[Series 6: head without sag · sagittal · non-contrast · 0.37mm/px · 3 of 67 slices shown]
[im 23/67  brain]
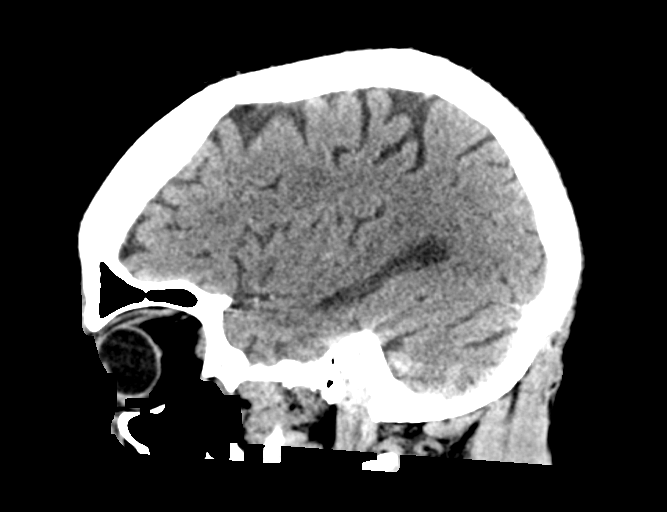
[im 34/67  brain]
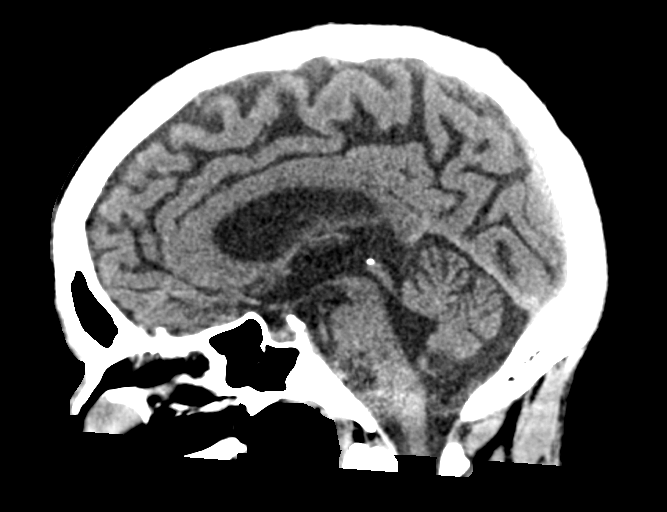
[im 45/67  brain]
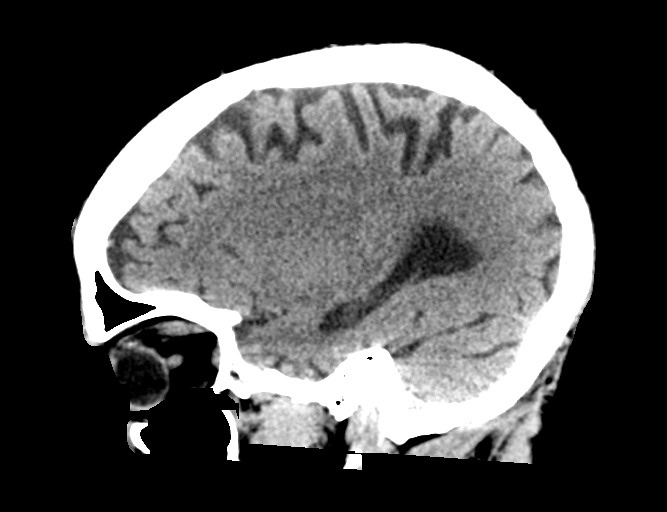

[17 of 47 positions shown; findings below may reference images not displayed]

FINDINGS: Brain: No evidence of acute infarction, hemorrhage, hydrocephalus,
extra-axial collection or mass lesion/mass effect.

Vascular: No hyperdense vessel or unexpected calcification.

Skull: Normal. Negative for fracture or focal lesion.

Sinuses/Orbits: No acute finding.

Other: None.
IMPRESSION: No acute intracranial pathology.

## 2021-06-09 ENCOUNTER — Other Ambulatory Visit: Payer: Self-pay

## 2021-06-09 ENCOUNTER — Encounter (HOSPITAL_COMMUNITY): Payer: Self-pay | Admitting: Emergency Medicine

## 2021-06-09 ENCOUNTER — Emergency Department (HOSPITAL_COMMUNITY): Payer: PRIVATE HEALTH INSURANCE

## 2021-06-09 ENCOUNTER — Emergency Department (HOSPITAL_COMMUNITY)
Admission: EM | Admit: 2021-06-09 | Discharge: 2021-06-09 | Disposition: A | Discharge status: Home / Self Care | Payer: PRIVATE HEALTH INSURANCE | Attending: Emergency Medicine | Admitting: Emergency Medicine

## 2021-06-09 DIAGNOSIS — M791 Myalgia, unspecified site: Secondary | ICD-10-CM | POA: Insufficient documentation

## 2021-06-09 DIAGNOSIS — F17200 Nicotine dependence, unspecified, uncomplicated: Secondary | ICD-10-CM | POA: Insufficient documentation

## 2021-06-09 DIAGNOSIS — R252 Cramp and spasm: Secondary | ICD-10-CM | POA: Insufficient documentation

## 2021-06-09 DIAGNOSIS — R519 Headache, unspecified: Secondary | ICD-10-CM | POA: Insufficient documentation

## 2021-06-09 LAB — CBC WITH DIFFERENTIAL/PLATELET
Abs Immature Granulocytes: 0.01 10*3/uL (ref 0.00–0.07)
Basophils Absolute: 0.1 10*3/uL (ref 0.0–0.1)
Basophils Relative: 1 %
Eosinophils Absolute: 0.1 10*3/uL (ref 0.0–0.5)
Eosinophils Relative: 2 %
HCT: 38.2 % — ABNORMAL LOW (ref 39.0–52.0)
Hemoglobin: 13.2 g/dL (ref 13.0–17.0)
Immature Granulocytes: 0 %
Lymphocytes Relative: 54 %
Lymphs Abs: 3.2 10*3/uL (ref 0.7–4.0)
MCH: 31.9 pg (ref 26.0–34.0)
MCHC: 34.6 g/dL (ref 30.0–36.0)
MCV: 92.3 fL (ref 80.0–100.0)
Monocytes Absolute: 0.6 10*3/uL (ref 0.1–1.0)
Monocytes Relative: 10 %
Neutro Abs: 2 10*3/uL (ref 1.7–7.7)
Neutrophils Relative %: 33 %
Platelets: 163 10*3/uL (ref 150–400)
RBC: 4.14 MIL/uL — ABNORMAL LOW (ref 4.22–5.81)
RDW: 15.2 % (ref 11.5–15.5)
WBC: 6 10*3/uL (ref 4.0–10.5)
nRBC: 0 % (ref 0.0–0.2)

## 2021-06-09 LAB — MAGNESIUM: Magnesium: 2.1 mg/dL (ref 1.7–2.4)

## 2021-06-09 LAB — BASIC METABOLIC PANEL
Anion gap: 13 (ref 5–15)
BUN: 6 mg/dL (ref 6–20)
CO2: 22 mmol/L (ref 22–32)
Calcium: 9.2 mg/dL (ref 8.9–10.3)
Chloride: 102 mmol/L (ref 98–111)
Creatinine, Ser: 0.73 mg/dL (ref 0.61–1.24)
GFR, Estimated: >60 mL/min (ref >60)
Glucose, Bld: 87 mg/dL (ref 70–99)
Potassium: 3.8 mmol/L (ref 3.5–5.1)
Sodium: 137 mmol/L (ref 135–145)

## 2021-06-09 LAB — CK: Total CK: 330 U/L (ref 49–397)

## 2021-06-09 MED ORDER — KETOROLAC TROMETHAMINE 60 MG/2ML IM SOLN
60.0000 mg | Freq: Once | INTRAMUSCULAR | Status: AC
  Administered 2021-06-09: 60 mg via INTRAMUSCULAR
  Filled 2021-06-09: qty 2

## 2021-06-09 MED ORDER — ORPHENADRINE CITRATE 30 MG/ML IJ SOLN
60.0000 mg | Freq: Once | INTRAMUSCULAR | Status: DC
  Filled 2021-06-09: qty 2

## 2021-06-09 MED ORDER — METHOCARBAMOL 500 MG PO TABS: 1000.0000 mg | ORAL_TABLET | Freq: Two times a day (BID) | ORAL | 0 refills | Status: DC

## 2021-06-09 MED ORDER — ORPHENADRINE CITRATE ER 100 MG PO TB12
100.0000 mg | ORAL_TABLET | Freq: Once | ORAL | Status: AC
  Administered 2021-06-09: 100 mg via ORAL
  Filled 2021-06-09: qty 1

## 2021-06-09 NOTE — ED Notes (Signed)
Pt refused VS  

## 2021-06-09 NOTE — ED Provider Notes (Signed)
Emergency Medicine Provider Triage Evaluation Note  Fletcher Ostermiller , a 30 y.o. male  was evaluated in triage.  Pt complains of questionable syncopal episode vs seizure today. Pt reports that he began having a "burning" sensation from his R lower leg all the way up to his right neck today that caused him to pass out. He states he was on the bed when this happened and it caused him to fall onto the floor. He think she hit his head and now has a headache. Pt states that this has happened to him in the past but he cannot afford the medication.  He states he went to see a PCP last week after applying for medicaid but is unsure what they did for him. Pt is a poor historian.   Per chart review pt has been seen for lumbar strain, seizure like activity, and elevated CK in the past.   Review of Systems  Positive: + pain, syncopal episode, HA Negative: - vision changes, urinary incontinence, tongue laceration  Physical Exam  BP (!) 115/91   Pulse (!) 108   Temp 98.2 F (36.8 C)   Resp 18   SpO2 97%  Gen:   Awake, no distress   Resp:  Normal effort  MSK:   Moves extremities without difficulty  Other:  Neuro intact  Medical Decision Making  Medically screening exam initiated at 12:57 PM.  Appropriate orders placed.  [name] was informed that the remainder of the evaluation will be completed by another provider, this initial triage assessment does not replace that evaluation, and the importance of remaining in the ED until their evaluation is complete.  Labs and CT head ordered at this time. History limited as pt is a poor historian.    Tanda Rockers, PA-C 06/09/21 1259    Sloan Leiter, DO 06/09/21 1715

## 2021-06-09 NOTE — ED Triage Notes (Addendum)
Pt reports ongoing pain from R foot to his neck x 4 months.  States pain is on entire R side of body and that this is his 4th visit for same.  States he passed out today due to pain.

## 2021-06-09 NOTE — ED Provider Notes (Signed)
[name] EMERGENCY DEPARTMENT Provider Note   CSN: [id number] Arrival date & time: 06/09/21  1233     History Chief Complaint  Patient presents with   Pain    John Krueger is a 30 y.o. male.  30 year old male with history as below presented ER secondary to right-sided pain.  Reports discomfort going on for years.  He has been evaluated by physician multiple times concerning this discomfort.  Reports in the past he was given a pain medication which improved his symptoms for a few hours and then returned shortly after.  Described as a muscle cramp, spasm sensation.  Feels that it begins in his feet, leg on the right.  Pain travels up his body to his hand.  Associated with headaches intermittently.  No fevers, chills, nausea or vomiting.  No change in bowel or bladder function.  No saddle paresthesias.  No numbness or tingling.  No blurry vision or double vision.  No recent trauma.  No history of spinal injections.  Denies IV drug use.  No statin use.  Good compliance with Keppra, reports last seizure > 1 month ago.  The history is provided by the patient. No language interpreter was used.      Past Medical History:  Diagnosis Date   Seizures (HCC)     There are no problems to display for this patient.   History reviewed. No pertinent surgical history.     History reviewed. No pertinent family history.  Social History   Tobacco Use   Smoking status: Every Day   Smokeless tobacco: Never  Substance Use Topics   Alcohol use: No   Drug use: No    Home Medications Prior to Admission medications   Medication Sig Start Date End Date Taking? Authorizing Provider  methocarbamol (ROBAXIN) 500 MG tablet Take 2 tablets (1,000 mg total) by mouth 2 (two) times daily. 06/09/21  Yes Tanda Rockers A, DO  doxycycline (VIBRAMYCIN) 100 MG capsule Take 1 capsule (100 mg total) by mouth 2 (two) times daily. One po bid x 7 days 10/26/20   Dartha Lodge, PA-C   levETIRAcetam (KEPPRA) 500 MG tablet Take 1 tablet (500 mg total) by mouth 2 (two) times daily. 03/12/21 04/11/21  Farrel Gordon, PA-C  tetrahydrozoline 0.05 % ophthalmic solution Place 2 drops into both eyes daily as needed (Dry eyes).    [provider]    Allergies    Patient has no known allergies.  Review of Systems   Review of Systems  Constitutional:  Negative for chills and fever.  HENT:  Negative for facial swelling and trouble swallowing.   Eyes:  Negative for photophobia and visual disturbance.  Respiratory:  Negative for cough and shortness of breath.   Cardiovascular:  Negative for chest pain and palpitations.  Gastrointestinal:  Negative for abdominal pain, nausea and vomiting.  Endocrine: Negative for polydipsia and polyuria.  Genitourinary:  Negative for difficulty urinating and hematuria.  Musculoskeletal:  Positive for back pain and myalgias. Negative for gait problem and joint swelling.  Skin:  Negative for pallor and rash.  Neurological:  Negative for syncope and headaches.  Psychiatric/Behavioral:  Negative for agitation and confusion.    Physical Exam Updated Vital Signs BP 112/77 (BP Location: Left Arm)   Pulse 79   Temp 98.4 F (36.9 C)   Resp 17   SpO2 100%   Physical Exam Vitals and nursing note reviewed.  Constitutional:      General: He is not in acute  distress.    Appearance: He is well-developed.  HENT:     Head: Normocephalic and atraumatic.     Right Ear: External ear normal.     Left Ear: External ear normal.     Mouth/Throat:     Mouth: Mucous membranes are moist.  Eyes:     General: No scleral icterus.    Extraocular Movements: Extraocular movements intact.     Pupils: Pupils are equal, round, and reactive to light.  Cardiovascular:     Rate and Rhythm: Normal rate and regular rhythm.     Pulses: Normal pulses.     Heart sounds: Normal heart sounds.  Pulmonary:     Effort: Pulmonary effort is normal. No respiratory  distress.     Breath sounds: Normal breath sounds.  Abdominal:     General: Abdomen is flat.     Palpations: Abdomen is soft.     Tenderness: There is no abdominal tenderness.  Musculoskeletal:        General: Normal range of motion.     Cervical back: Normal range of motion.     Right lower leg: No edema.     Left lower leg: No edema.     Comments: No midline spinous process tenderness palpation or percussion.  No crepitus or step-off. No saddle paresthesias.   Skin:    General: Skin is warm and dry.     Capillary Refill: Capillary refill takes less than 2 seconds.  Neurological:     Mental Status: He is alert and oriented to person, place, and time.     GCS: GCS eye subscore is 4. GCS verbal subscore is 5. GCS motor subscore is 6.     Cranial Nerves: Cranial nerves are intact.     Sensory: Sensation is intact.     Motor: Motor function is intact.     Coordination: Coordination is intact.     Gait: Gait is intact.  Psychiatric:        Mood and Affect: Mood normal.        Behavior: Behavior normal.    ED Results / Procedures / Treatments   Labs (all labs ordered are listed, but only abnormal results are displayed) Labs Reviewed  CBC WITH DIFFERENTIAL/PLATELET - Abnormal; Notable for the following components:      Result Value   RBC 4.14 (*)    HCT 38.2 (*)    All other components within normal limits  BASIC METABOLIC PANEL  MAGNESIUM  CK    EKG EKG Interpretation  Date/Time:  Sunday June 09 2021 12:47:26 EDT Ventricular Rate:  98 PR Interval:  138 QRS Duration: 84 QT Interval:  326 QTC Calculation: 416 R Axis:   52 Text Interpretation: Normal sinus rhythm Normal ECG Question early repol Similar to prior tracing Confirmed by Tanda Rockers ([id number]) on 06/09/2021 7:02:57 PM  Radiology CT Head Wo Contrast  Result Date: 06/09/2021 CLINICAL DATA:  Head injury, seizure EXAM: CT HEAD WITHOUT CONTRAST TECHNIQUE: Contiguous axial images were obtained from the base of the  skull through the vertex without intravenous contrast. COMPARISON:  03/12/2021 FINDINGS: Brain: There is mild parenchymal volume loss, advanced in relation of the patient's given age. This appears stable since prior examination. No abnormal intra or extra-axial mass lesion or fluid collection. No abnormal mass effect or midline shift. No evidence of acute intracranial hemorrhage or infarct. Ventricular size is normal. Cerebellum unremarkable. Vascular: Unremarkable Skull: Intact Sinuses/Orbits: There is mild mucosal thickening within the right frontal sinus. No  air-fluid levels. Remaining paranasal sinuses are clear. Orbits are unremarkable. Other: Mastoid air cells and middle ear cavities are clear. IMPRESSION: No acute intracranial abnormality. Stable diffuse cerebral atrophy, advanced in relation to the patient's stated age. Mild right frontal paranasal sinus disease. Electronically Signed   By: Helyn Numbers M.D.   On: 06/09/2021 13:36    Procedures Procedures   Medications Ordered in ED Medications  ketorolac (TORADOL) injection 60 mg (has no administration in time range)  orphenadrine (NORFLEX) 12 hr tablet 100 mg (has no administration in time range)    ED Course  I have reviewed the triage vital signs and the nursing notes.  Pertinent labs & imaging results that were available during my care of the patient were reviewed by me and considered in my medical decision making (see chart for details).    MDM Rules/Calculators/A&P                            This is a 30 year old male with right-sided myalgias.  History as noted above.  Neurologic exam in the ER is nonfocal.  Vital signs reviewed and are stable.  Serious etiology was considered.   Screening labs obtained by triage.  These are reassuring.  CPK is not acutely elevated.  CT head was also obtained which is nonacute. Labs stable otherwise.  No history statin use. Exam is not concerning for cauda equina, spinal cord compression,  infection, aneurysm, or other serious etiology although these were considered.   Will give analgesics, muscle relaxer. Symptoms improved following this. He is ambulatory, rpt neuro exam is non-focal. Etiology of patient's chronic myalgias is unclear at this time but doubt life threatening etiology currently.  Recommend patient follow-up with neurology for further evaluation, EMG.  He is agreeable.  Ambulatory referral to neuro ordered.   The patient improved significantly and was discharged in stable condition. Detailed discussions were had with the patient regarding current findings, and need for close f/u with PCP or on call doctor. The patient has been instructed to return immediately if the symptoms worsen in any way for re-evaluation. Patient verbalized understanding and is in agreement with current care plan. All questions answered prior to discharge.    Final Clinical Impression(s) / ED Diagnoses Final diagnoses:  Myalgia  Muscle cramps    Rx / DC Orders ED Discharge Orders          Ordered    Ambulatory referral to Neurology       Comments: An appointment is requested in approximately: 1 week   06/09/21 1729    methocarbamol (ROBAXIN) 500 MG tablet  2 times daily        06/09/21 1729             Sloan Leiter, DO 06/09/21 1904

## 2021-06-12 ENCOUNTER — Encounter (HOSPITAL_COMMUNITY): Payer: Self-pay | Admitting: Student

## 2021-06-12 ENCOUNTER — Emergency Department (HOSPITAL_COMMUNITY): Payer: PRIVATE HEALTH INSURANCE

## 2021-06-12 ENCOUNTER — Other Ambulatory Visit: Payer: Self-pay

## 2021-06-12 ENCOUNTER — Emergency Department (HOSPITAL_COMMUNITY)
Admission: EM | Admit: 2021-06-12 | Discharge: 2021-06-12 | Disposition: A | Discharge status: Home / Self Care | Payer: PRIVATE HEALTH INSURANCE | Attending: Emergency Medicine | Admitting: Emergency Medicine

## 2021-06-12 DIAGNOSIS — Y92149 Unspecified place in prison as the place of occurrence of the external cause: Secondary | ICD-10-CM | POA: Insufficient documentation

## 2021-06-12 DIAGNOSIS — F17200 Nicotine dependence, unspecified, uncomplicated: Secondary | ICD-10-CM | POA: Insufficient documentation

## 2021-06-12 DIAGNOSIS — R1110 Vomiting, unspecified: Secondary | ICD-10-CM

## 2021-06-12 DIAGNOSIS — M542 Cervicalgia: Secondary | ICD-10-CM | POA: Insufficient documentation

## 2021-06-12 DIAGNOSIS — W01198A Fall on same level from slipping, tripping and stumbling with subsequent striking against other object, initial encounter: Secondary | ICD-10-CM | POA: Insufficient documentation

## 2021-06-12 DIAGNOSIS — S0083XA Contusion of other part of head, initial encounter: Secondary | ICD-10-CM | POA: Diagnosis not present

## 2021-06-12 DIAGNOSIS — R569 Unspecified convulsions: Secondary | ICD-10-CM | POA: Diagnosis not present

## 2021-06-12 DIAGNOSIS — S0990XA Unspecified injury of head, initial encounter: Secondary | ICD-10-CM | POA: Diagnosis present

## 2021-06-12 LAB — CBC WITH DIFFERENTIAL/PLATELET
Abs Immature Granulocytes: 0.03 10*3/uL (ref 0.00–0.07)
Basophils Absolute: 0.1 10*3/uL (ref 0.0–0.1)
Basophils Relative: 1 %
Eosinophils Absolute: 0 10*3/uL (ref 0.0–0.5)
Eosinophils Relative: 1 %
HCT: 41.1 % (ref 39.0–52.0)
Hemoglobin: 13.9 g/dL (ref 13.0–17.0)
Immature Granulocytes: 0 %
Lymphocytes Relative: 36 %
Lymphs Abs: 2.7 10*3/uL (ref 0.7–4.0)
MCH: 31.9 pg (ref 26.0–34.0)
MCHC: 33.8 g/dL (ref 30.0–36.0)
MCV: 94.3 fL (ref 80.0–100.0)
Monocytes Absolute: 0.7 10*3/uL (ref 0.1–1.0)
Monocytes Relative: 9 %
Neutro Abs: 3.9 10*3/uL (ref 1.7–7.7)
Neutrophils Relative %: 53 %
Platelets: 144 10*3/uL — ABNORMAL LOW (ref 150–400)
RBC: 4.36 MIL/uL (ref 4.22–5.81)
RDW: 15 % (ref 11.5–15.5)
WBC: 7.4 10*3/uL (ref 4.0–10.5)
nRBC: 0 % (ref 0.0–0.2)

## 2021-06-12 LAB — BASIC METABOLIC PANEL
Anion gap: 19 — ABNORMAL HIGH (ref 5–15)
BUN: 7 mg/dL (ref 6–20)
CO2: 20 mmol/L — ABNORMAL LOW (ref 22–32)
Calcium: 9.5 mg/dL (ref 8.9–10.3)
Chloride: 100 mmol/L (ref 98–111)
Creatinine, Ser: 0.87 mg/dL (ref 0.61–1.24)
GFR, Estimated: >60 mL/min (ref >60)
Glucose, Bld: 117 mg/dL — ABNORMAL HIGH (ref 70–99)
Potassium: 3.7 mmol/L (ref 3.5–5.1)
Sodium: 139 mmol/L (ref 135–145)

## 2021-06-12 LAB — CBG MONITORING, ED: Glucose-Capillary: 138 mg/dL — ABNORMAL HIGH (ref 70–99)

## 2021-06-12 MED ORDER — LEVETIRACETAM IN NACL 1500 MG/100ML IV SOLN
1500.0000 mg | Freq: Once | INTRAVENOUS | Status: AC
  Administered 2021-06-12: 1500 mg via INTRAVENOUS
  Filled 2021-06-12: qty 100

## 2021-06-12 MED ORDER — ONDANSETRON HCL 4 MG/2ML IJ SOLN
4.0000 mg | Freq: Once | INTRAMUSCULAR | Status: AC
  Administered 2021-06-12: 4 mg via INTRAVENOUS
  Filled 2021-06-12: qty 2

## 2021-06-12 MED ORDER — LEVETIRACETAM 500 MG PO TABS: 500.0000 mg | ORAL_TABLET | Freq: Two times a day (BID) | ORAL | 0 refills | Status: DC

## 2021-06-12 NOTE — ED Triage Notes (Addendum)
[facility] after a witness seizure in the upper body only lasting about 30 second. Hx of seizures.  EMS placed a 20g left AC, no seizure with EMS so they gave no meds. Patient vomited a large amount on arrival to the ER. He [facility] that he did take his seizure medication today. Patient arrives with a [facility] deputy at bedside. Last seizure was Sunday. Patient did hit his head and has a hematoma his right forehead. Patient arrives with C-collar.  160/112 90-HR 16-RR 99% room air 87-CBG

## 2021-06-12 NOTE — ED Provider Notes (Signed)
[facility]-EMERGENCY DEPT Provider Note   CSN: [id number] Arrival date & time: 06/12/21  1740     History Chief Complaint  Patient presents with   Emesis   Seizures    John Krueger is a 30 y.o. male.  29 yo male history as below significant for seizures on Keppra presents emergency department secondary to seizure activity. [facility]izure at jail lasting approximately 30 seconds, similar to prior seizures.  Similar prodrome to typical seizures.  Postictal following the seizure.  Nauseated and vomited x1.  No abdominal pain, chest pain, dyspnea.  No further seizure activity since initial episode.  Compliant with home antiepileptics.  Following seizure patient fell and hit his head, right forehead hematoma.  Also complaining of midline neck pain to his cervical spine.  He has chronic myalgias/ senation changes to the right side of his body.  Ongoing for approximately 2 years; unchanged. Feels he is back to baseline. No tongue injury reported. No recent ETOH use.   The history is provided by the patient. No language interpreter was used.  Emesis Associated symptoms: headaches   Associated symptoms: no abdominal pain, no chills, no cough and no fever   Seizures Seizure activity on arrival: no   Preceding symptoms: aura   Initial focality:  Upper extremity Episode characteristics: abnormal movements and generalized shaking   Postictal symptoms: confusion and somnolence   Return to baseline: yes   Timing:  Once Progression:  Resolved Context: decreased sleep and stress   Recent head injury:  No recent head injuries PTA treatment:  None History of seizures: yes       Past Medical History:  Diagnosis Date   Seizures (HCC)     There are no problems to display for this patient.   History reviewed. No pertinent surgical history.     History reviewed. No pertinent family history.  Social History   Tobacco Use   Smoking status: Every Day   Smokeless tobacco:  Never  Substance Use Topics   Alcohol use: No   Drug use: No    Home Medications Prior to Admission medications   Medication Sig Start Date End Date Taking? Authorizing Provider  doxycycline (VIBRAMYCIN) 100 MG capsule Take 1 capsule (100 mg total) by mouth 2 (two) times daily. One po bid x 7 days 10/26/20   Dartha Lodge, PA-C  levETIRAcetam (KEPPRA) 500 MG tablet Take 1 tablet (500 mg total) by mouth 2 (two) times daily. 06/12/21 07/12/21  Sloan Leiter, DO  methocarbamol (ROBAXIN) 500 MG tablet Take 2 tablets (1,000 mg total) by mouth 2 (two) times daily. 06/09/21   Sloan Leiter, DO  tetrahydrozoline 0.05 % ophthalmic solution Place 2 drops into both eyes daily as needed (Dry eyes).    [provider]    Allergies    Patient has no known allergies.  Review of Systems   Review of Systems  Constitutional:  Positive for fatigue. Negative for chills and fever.  HENT:  Negative for facial swelling and trouble swallowing.   Eyes:  Negative for photophobia and visual disturbance.  Respiratory:  Negative for cough and shortness of breath.   Cardiovascular:  Negative for chest pain and palpitations.  Gastrointestinal:  Positive for vomiting. Negative for abdominal pain and nausea.  Endocrine: Negative for polydipsia and polyuria.  Genitourinary:  Negative for difficulty urinating and hematuria.  Musculoskeletal:  Negative for gait problem and joint swelling.  Skin:  Negative for pallor and rash.  Neurological:  Positive for  seizures and headaches. Negative for syncope.  Psychiatric/Behavioral:  Negative for agitation and confusion.    Physical Exam Updated Vital Signs BP (!) 149/108 (BP Location: Left Arm)   Pulse 81   Temp 99 F (37.2 C) (Oral)   Resp 16   Ht 5\' 5"  (1.651 m)   Wt 73 kg   SpO2 99%   BMI 26.78 kg/m   Physical Exam Vitals and nursing note reviewed.  Constitutional:      General: He is not in acute distress.    Appearance: He is well-developed.   HENT:     Head: Normocephalic. No raccoon eyes, Battle's sign, right periorbital erythema or left periorbital erythema.      Comments: Mild TTP to forehead hematoma, no crepitus. No jaw trismus. No septal hematoma    Right Ear: External ear normal.     Left Ear: External ear normal.     Mouth/Throat:     Mouth: Mucous membranes are moist.  Eyes:     General: Vision grossly intact. Gaze aligned appropriately. No scleral icterus.    Extraocular Movements: Extraocular movements intact.     Pupils: Pupils are equal, round, and reactive to light.  Cardiovascular:     Rate and Rhythm: Normal rate and regular rhythm.     Pulses: Normal pulses.          Radial pulses are 2+ on the right side and 2+ on the left side.       Dorsalis pedis pulses are 2+ on the right side and 2+ on the left side.     Heart sounds: Normal heart sounds.  Pulmonary:     Effort: Pulmonary effort is normal. No respiratory distress.     Breath sounds: Normal breath sounds.  Abdominal:     General: Abdomen is flat.     Palpations: Abdomen is soft.     Tenderness: There is no abdominal tenderness.  Musculoskeletal:        General: Normal range of motion.     Cervical back: Normal range of motion.     Right lower leg: No edema.     Left lower leg: No edema.     Comments: Left wrist and left ankle hand cuffs   Skin:    General: Skin is warm and dry.     Capillary Refill: Capillary refill takes less than 2 seconds.  Neurological:     Mental Status: He is alert and oriented to person, place, and time.     GCS: GCS eye subscore is 4. GCS verbal subscore is 5. GCS motor subscore is 6.     Cranial Nerves: Cranial nerves are intact.     Sensory: Sensation is intact.     Motor: Motor function is intact.     Coordination: Coordination is intact.  Psychiatric:        Mood and Affect: Mood normal.        Behavior: Behavior normal.    ED Results / Procedures / Treatments   Labs (all labs ordered are listed, but only  abnormal results are displayed) Labs Reviewed  CBC WITH DIFFERENTIAL/PLATELET - Abnormal; Notable for the following components:      Result Value   Platelets 144 (*)    All other components within normal limits  BASIC METABOLIC PANEL - Abnormal; Notable for the following components:   CO2 20 (*)    Glucose, Bld 117 (*)    Anion gap 19 (*)    All other components within  normal limits  CBG MONITORING, ED - Abnormal; Notable for the following components:   Glucose-Capillary 138 (*)    All other components within normal limits  LEVETIRACETAM LEVEL  CBG MONITORING, ED    EKG None  Radiology No results found.  Procedures Procedures   Medications Ordered in ED Medications  ondansetron (ZOFRAN) injection 4 mg (4 mg Intravenous Given 06/12/21 1811)  levETIRAcetam (KEPPRA) IVPB 1500 mg/ 100 mL premix (0 mg Intravenous Stopped 06/12/21 2005)    ED Course  I have reviewed the triage vital signs and the nursing notes.  Pertinent labs & imaging results that were available during my care of the patient were reviewed by me and considered in my medical decision making (see chart for details).    MDM Rules/Calculators/A&P                          This patient complains of seizure; this involves an extensive number of treatment Options and is a complaint that carries with it a high risk of complications and Morbidity. Serious etiologies considered.   I ordered, reviewed and interpreted labs.  These have been reviewed and are stable.  I ordered medication Keppra load, zofran  I ordered imaging studies which included CT head and CT C-spine and I independently    visualized and interpreted imaging which showed no acute abnormalities  Additional history obtained from EMS/PD  Previous records obtained and reviewed.  CT without obvious fracture or deformities. Patient has no midline cervical spine tenderness on palpation or with full range of motion. Patient denies radiculopathy-like  symptoms and is moving all four extremities freely without any weakness. Patient is not intoxicated and is without altered mental status. Patient was cleared from cervical collar.  Further assessment the patient, reports that he has been having trouble obtaining his Keppra and has not been taking it as prescribed.  Unable to refill his Keppra.  D/w police at bedside who will able to [facility].  No further nausea, reports he often feels nausea after seizures. He is tolerating PO.   Advised patient to f/u with his neurologist, also advised him that he is not to drive for next 6 mos until seizures are better controlled and cleared by neurology. Verbalized understanding  The patient improved significantly and was discharged in stable condition. Detailed discussions were had with the patient regarding current findings, and need for close f/u with PCP or on call doctor. The patient has been instructed to return immediately if the symptoms worsen in any way for re-evaluation. Patient verbalized understanding and is in agreement with current care plan. All questions answered prior to discharge.    Final Clinical Impression(s) / ED Diagnoses Final diagnoses:  Seizures (HCC)  Vomiting, intractability of vomiting not specified, presence of nausea not specified, unspecified vomiting type    Rx / DC Orders ED Discharge Orders          Ordered    levETIRAcetam (KEPPRA) 500 MG tablet  2 times daily        06/12/21 2243             Sloan Leiter, DO 06/12/21 2307

## 2021-06-14 LAB — LEVETIRACETAM LEVEL: Levetiracetam Lvl: <1 ug/mL — ABNORMAL LOW (ref 10.0–40.0)

## 2021-07-04 ENCOUNTER — Emergency Department (HOSPITAL_COMMUNITY)
Admission: EM | Admit: 2021-07-04 | Discharge: 2021-07-05 | Disposition: A | Discharge status: Home / Self Care | Payer: PRIVATE HEALTH INSURANCE | Attending: Emergency Medicine | Admitting: Emergency Medicine

## 2021-07-04 ENCOUNTER — Other Ambulatory Visit: Payer: Self-pay

## 2021-07-04 DIAGNOSIS — F17200 Nicotine dependence, unspecified, uncomplicated: Secondary | ICD-10-CM | POA: Insufficient documentation

## 2021-07-04 DIAGNOSIS — G8929 Other chronic pain: Secondary | ICD-10-CM

## 2021-07-04 DIAGNOSIS — M7918 Myalgia, other site: Secondary | ICD-10-CM | POA: Insufficient documentation

## 2021-07-04 DIAGNOSIS — M549 Dorsalgia, unspecified: Secondary | ICD-10-CM | POA: Insufficient documentation

## 2021-07-04 DIAGNOSIS — W01198A Fall on same level from slipping, tripping and stumbling with subsequent striking against other object, initial encounter: Secondary | ICD-10-CM | POA: Insufficient documentation

## 2021-07-04 NOTE — ED Triage Notes (Signed)
Pt states he fell 8/17, hit R flank/back. Continued pain since, unable to sleep. Hx seizures, compliant w home prescription of keppra.

## 2021-07-05 LAB — BASIC METABOLIC PANEL
Anion gap: 11 (ref 5–15)
BUN: 8 mg/dL (ref 6–20)
CO2: 25 mmol/L (ref 22–32)
Calcium: 9.2 mg/dL (ref 8.9–10.3)
Chloride: 103 mmol/L (ref 98–111)
Creatinine, Ser: 0.87 mg/dL (ref 0.61–1.24)
GFR, Estimated: >60 mL/min (ref >60)
Glucose, Bld: 73 mg/dL (ref 70–99)
Potassium: 3.8 mmol/L (ref 3.5–5.1)
Sodium: 139 mmol/L (ref 135–145)

## 2021-07-05 LAB — CBC
HCT: 37.8 % — ABNORMAL LOW (ref 39.0–52.0)
Hemoglobin: 12.7 g/dL — ABNORMAL LOW (ref 13.0–17.0)
MCH: 32.1 pg (ref 26.0–34.0)
MCHC: 33.6 g/dL (ref 30.0–36.0)
MCV: 95.5 fL (ref 80.0–100.0)
Platelets: 278 10*3/uL (ref 150–400)
RBC: 3.96 MIL/uL — ABNORMAL LOW (ref 4.22–5.81)
RDW: 13.7 % (ref 11.5–15.5)
WBC: 8.6 10*3/uL (ref 4.0–10.5)
nRBC: 0 % (ref 0.0–0.2)

## 2021-07-05 LAB — URINALYSIS, ROUTINE W REFLEX MICROSCOPIC
Bilirubin Urine: NEGATIVE
Glucose, UA: NEGATIVE mg/dL
Hgb urine dipstick: NEGATIVE
Ketones, ur: NEGATIVE mg/dL
Leukocytes,Ua: NEGATIVE
Nitrite: NEGATIVE
Protein, ur: NEGATIVE mg/dL
Specific Gravity, Urine: <1.005 — ABNORMAL LOW (ref 1.005–1.030)
pH: 6.5 (ref 5.0–8.0)

## 2021-07-05 MED ORDER — KETOROLAC TROMETHAMINE 15 MG/ML IJ SOLN
15.0000 mg | Freq: Once | INTRAMUSCULAR | Status: AC
  Administered 2021-07-05: 15 mg via INTRAMUSCULAR
  Filled 2021-07-05: qty 1

## 2021-07-05 MED ORDER — TIZANIDINE HCL 2 MG PO TABS: 2.0000 mg | ORAL_TABLET | Freq: Three times a day (TID) | ORAL | 0 refills | Status: DC | PRN

## 2021-07-05 MED ORDER — NAPROXEN 500 MG PO TABS: 500.0000 mg | ORAL_TABLET | Freq: Two times a day (BID) | ORAL | 0 refills | Status: DC

## 2021-07-05 NOTE — Discharge Instructions (Addendum)
Take naproxen 2 times a day with meals.  Do not take other anti-inflammatories at the same time (Advil, Motrin, ibuprofen, Aleve). You may supplement with Tylenol if you need further pain control. Use ice packs or heating pads if this helps control your pain. Use tizanidine as needed for muscle pain.  Do not take with Robaxin. Continue taking home medications, especially your Keppra, as prescribed. Follow-up with orthopedic doctor as needed for further evaluation of your pain. Return to the emergency room with any new, worsening, concerning symptoms

## 2021-07-05 NOTE — ED Provider Notes (Signed)
[facility] EMERGENCY DEPARTMENT Provider Note   CSN: [id number] Arrival date & time: 07/04/21  2316     History Chief Complaint  Patient presents with   John Krueger    John Krueger is a 30 y.o. male presenting for evaluation of back pain after fall.  Patient states about a month ago he fell, and since then he has had increased back pain.  He does not know how he fell, as it was during a seizure.  He has been taking medication as prescribed by another doctor, but does not know the name of it, but states it has not been helping.  He denies new trauma or injury.  No radiation into his legs.  No loss of bowel bladder control.  No numbness or tingling.  Additional history obtained in chart review.  Patient with a history of seizures.  He has been seen in the ER multiple times in the past several months for diffuse back pain, even predating this fall.  HPI     Past Medical History:  Diagnosis Date   Seizures (HCC)     There are no problems to display for this patient.   No past surgical history on file.     No family history on file.  Social History   Tobacco Use   Smoking status: Every Day   Smokeless tobacco: Never  Substance Use Topics   Alcohol use: No   Drug use: No    Home Medications Prior to Admission medications   Medication Sig Start Date End Date Taking? Authorizing Provider  naproxen (NAPROSYN) 500 MG tablet Take 1 tablet (500 mg total) by mouth 2 (two) times daily with a meal. 07/05/21  Yes Tenisha Fleece, PA-C  tiZANidine (ZANAFLEX) 2 MG tablet Take 1 tablet (2 mg total) by mouth every 8 (eight) hours as needed for muscle spasms. 07/05/21  Yes Raistlin Gum, PA-C  doxycycline (VIBRAMYCIN) 100 MG capsule Take 1 capsule (100 mg total) by mouth 2 (two) times daily. One po bid x 7 days 10/26/20   Dartha Lodge, PA-C  levETIRAcetam (KEPPRA) 500 MG tablet Take 1 tablet (500 mg total) by mouth 2 (two) times daily. 06/12/21 07/12/21  Sloan Leiter, DO   methocarbamol (ROBAXIN) 500 MG tablet Take 2 tablets (1,000 mg total) by mouth 2 (two) times daily. 06/09/21   Sloan Leiter, DO  tetrahydrozoline 0.05 % ophthalmic solution Place 2 drops into both eyes daily as needed (Dry eyes).    [provider]    Allergies    Patient has no known allergies.  Review of Systems   Review of Systems  Musculoskeletal:  Positive for back pain and myalgias.  All other systems reviewed and are negative.  Physical Exam Updated Vital Signs BP 114/82   Pulse 85   Temp 97.6 F (36.4 C)   Resp 16   SpO2 95%   Physical Exam Vitals and nursing note reviewed.  Constitutional:      General: He is not in acute distress.    Appearance: Normal appearance.  HENT:     Head: Normocephalic and atraumatic.  Eyes:     Conjunctiva/sclera: Conjunctivae normal.     Pupils: Pupils are equal, round, and reactive to light.  Cardiovascular:     Rate and Rhythm: Normal rate and regular rhythm.     Pulses: Normal pulses.  Pulmonary:     Effort: Pulmonary effort is normal. No respiratory distress.     Breath sounds: Normal breath sounds.  No wheezing.     Comments: Speaking in full sentences.  Clear lung sounds in all fields. Abdominal:     General: There is no distension.     Palpations: Abdomen is soft.     Tenderness: There is no abdominal tenderness.  Musculoskeletal:        General: Tenderness present. Normal range of motion.     Cervical back: Normal range of motion and neck supple.     Comments: Diffuse tenderness palpation of the entire back without focal midline pain.  No signs of trauma or injury.  Ambulatory without difficulty.  Full active range of motion of upper and lower extremities.  No saddle anesthesia  Skin:    General: Skin is warm and dry.     Capillary Refill: Capillary refill takes less than 2 seconds.  Neurological:     Mental Status: He is alert and oriented to person, place, and time.  Psychiatric:        Mood and Affect:  Mood and affect normal.        Speech: Speech normal.        Behavior: Behavior normal.    ED Results / Procedures / Treatments   Labs (all labs ordered are listed, but only abnormal results are displayed) Labs Reviewed  URINALYSIS, ROUTINE W REFLEX MICROSCOPIC - Abnormal; Notable for the following components:      Result Value   Specific Gravity, Urine <1.005 (*)    All other components within normal limits  CBC - Abnormal; Notable for the following components:   RBC 3.96 (*)    Hemoglobin 12.7 (*)    HCT 37.8 (*)    All other components within normal limits  BASIC METABOLIC PANEL    EKG None  Radiology No results found.  Procedures Procedures   Medications Ordered in ED Medications  ketorolac (TORADOL) 15 MG/ML injection 15 mg (has no administration in time range)    ED Course  I have reviewed the triage vital signs and the nursing notes.  Pertinent labs & imaging results that were available during my care of the patient were reviewed by me and considered in my medical decision making (see chart for details).    MDM Rules/Calculators/A&P                           Patient presenting for evaluation of diffuse back pain.  On exam, patient is nontoxic.  No focal pain.  No neurologic deficits.  While this occurred in the setting of trauma, this was about a month ago, patient without focal midline pain.  Low suspicion for fracture.  Discussed with patient, he does not want to get an x-ray today.  Discussed continued symptomatic management, we will have him follow-up with orthopedics if symptoms not improving.  At this time, patient appears safe for a discharge.  Return precautions given.  Patient states he understands and agrees to plan.   Final Clinical Impression(s) / ED Diagnoses Final diagnoses:  Chronic back pain, unspecified back location, unspecified back pain laterality    Rx / DC Orders ED Discharge Orders          Ordered    naproxen (NAPROSYN) 500 MG  tablet  2 times daily with meals        07/05/21 0616    tiZANidine (ZANAFLEX) 2 MG tablet  Every 8 hours PRN        07/05/21 0616  Alveria Apley, PA-C 07/05/21 0973    Dione Booze, MD 07/05/21 [phone]

## 2021-07-12 ENCOUNTER — Emergency Department (HOSPITAL_COMMUNITY)
Admission: EM | Admit: 2021-07-12 | Discharge: 2021-07-13 | Disposition: A | Discharge status: Home / Self Care | Payer: Self-pay | Attending: Emergency Medicine | Admitting: Emergency Medicine

## 2021-07-12 DIAGNOSIS — F17210 Nicotine dependence, cigarettes, uncomplicated: Secondary | ICD-10-CM | POA: Insufficient documentation

## 2021-07-12 DIAGNOSIS — M791 Myalgia, unspecified site: Secondary | ICD-10-CM

## 2021-07-12 DIAGNOSIS — M546 Pain in thoracic spine: Secondary | ICD-10-CM | POA: Insufficient documentation

## 2021-07-12 DIAGNOSIS — G40909 Epilepsy, unspecified, not intractable, without status epilepticus: Secondary | ICD-10-CM

## 2021-07-12 DIAGNOSIS — R55 Syncope and collapse: Secondary | ICD-10-CM | POA: Insufficient documentation

## 2021-07-13 ENCOUNTER — Encounter (HOSPITAL_COMMUNITY): Payer: Self-pay

## 2021-07-13 ENCOUNTER — Other Ambulatory Visit: Payer: Self-pay

## 2021-07-13 ENCOUNTER — Emergency Department (HOSPITAL_COMMUNITY): Payer: Self-pay

## 2021-07-13 LAB — CBC
HCT: 36.9 % — ABNORMAL LOW (ref 39.0–52.0)
Hemoglobin: 12.4 g/dL — ABNORMAL LOW (ref 13.0–17.0)
MCH: 31.5 pg (ref 26.0–34.0)
MCHC: 33.6 g/dL (ref 30.0–36.0)
MCV: 93.7 fL (ref 80.0–100.0)
Platelets: 190 10*3/uL (ref 150–400)
RBC: 3.94 MIL/uL — ABNORMAL LOW (ref 4.22–5.81)
RDW: 14.2 % (ref 11.5–15.5)
WBC: 7.9 10*3/uL (ref 4.0–10.5)
nRBC: 0 % (ref 0.0–0.2)

## 2021-07-13 LAB — BASIC METABOLIC PANEL
Anion gap: 15 (ref 5–15)
BUN: 7 mg/dL (ref 6–20)
CO2: 20 mmol/L — ABNORMAL LOW (ref 22–32)
Calcium: 8.8 mg/dL — ABNORMAL LOW (ref 8.9–10.3)
Chloride: 101 mmol/L (ref 98–111)
Creatinine, Ser: 0.72 mg/dL (ref 0.61–1.24)
GFR, Estimated: >60 mL/min (ref >60)
Glucose, Bld: 75 mg/dL (ref 70–99)
Potassium: 4 mmol/L (ref 3.5–5.1)
Sodium: 136 mmol/L (ref 135–145)

## 2021-07-13 MED ORDER — METHOCARBAMOL 500 MG PO TABS: 1000.0000 mg | ORAL_TABLET | Freq: Two times a day (BID) | ORAL | 0 refills | Status: DC

## 2021-07-13 MED ORDER — LEVETIRACETAM 500 MG PO TABS: ORAL_TABLET | ORAL | 0 refills | Status: DC

## 2021-07-13 MED ORDER — NAPROXEN 500 MG PO TABS: 500.0000 mg | ORAL_TABLET | Freq: Two times a day (BID) | ORAL | 0 refills | Status: DC

## 2021-07-13 NOTE — ED Provider Notes (Signed)
[facility] EMERGENCY DEPARTMENT Provider Note   CSN: [id number] Arrival date & time: 07/12/21  2352     History Chief complaint: Back pain  John Krueger is a 30 y.o. male.  HPI  Patient presents to the ED with complaints of persistent pain in his rib and back area.  Patient states the symptoms have been ongoing for a while now.  He is having intermittent episodes of near syncope.  He states he will have sharp pain that goes throughout his chest and his back.  It moves up and down.  He seems to be a bit better when he is standing and walking around.  Gets worse when he is lying down.  He has not had any fevers.  No vomiting or diarrhea.  Patient has been seen several times in the past for similar symptoms.  Back in May he was seen for possible seizures as well as back pain.  Patient has been prescribed Keppra.  He has had imaging tests of the brain and spine most recently on August 17. Patient states he has been unable to make any follow-up appointments.  He presented back to the ED because of persistent symptoms Past Medical History:  Diagnosis Date   Seizures (HCC)     There are no problems to display for this patient.   History reviewed. No pertinent surgical history.     History reviewed. No pertinent family history.  Social History   Tobacco Use   Smoking status: Every Day   Smokeless tobacco: Never  Vaping Use   Vaping Use: Never used  Substance Use Topics   Alcohol use: No   Drug use: No    Home Medications Prior to Admission medications   Medication Sig Start Date End Date Taking? Authorizing Provider  doxycycline (VIBRAMYCIN) 100 MG capsule Take 1 capsule (100 mg total) by mouth 2 (two) times daily. One po bid x 7 days Patient not taking: Reported on 07/13/2021 10/26/20   Dartha Lodge, PA-C  levETIRAcetam (KEPPRA) 500 MG tablet Take 2 tablets (1,000 mg total) by mouth in the morning AND 1 tablet (500 mg total) every evening. 07/13/21 08/12/21   Linwood Dibbles, MD  methocarbamol (ROBAXIN) 500 MG tablet Take 2 tablets (1,000 mg total) by mouth 2 (two) times daily. 07/13/21   Linwood Dibbles, MD  naproxen (NAPROSYN) 500 MG tablet Take 1 tablet (500 mg total) by mouth 2 (two) times daily with a meal. 07/13/21   Linwood Dibbles, MD  tiZANidine (ZANAFLEX) 2 MG tablet Take 1 tablet (2 mg total) by mouth every 8 (eight) hours as needed for muscle spasms. Patient not taking: Reported on 07/13/2021 07/05/21   Caccavale, Sophia, PA-C    Allergies    Patient has no known allergies.  Review of Systems   Review of Systems  All other systems reviewed and are negative.  Physical Exam Updated Vital Signs BP 125/90   Pulse 70   Temp 97.8 F (36.6 C) (Oral)   Resp 16   SpO2 100%   Physical Exam Vitals and nursing note reviewed.  Constitutional:      General: He is not in acute distress.    Appearance: He is well-developed.  HENT:     Head: Normocephalic and atraumatic.     Right Ear: External ear normal.     Left Ear: External ear normal.  Eyes:     General: No scleral icterus.       Right eye: No discharge.  Left eye: No discharge.     Conjunctiva/sclera: Conjunctivae normal.  Neck:     Trachea: No tracheal deviation.  Cardiovascular:     Rate and Rhythm: Normal rate and regular rhythm.  Pulmonary:     Effort: Pulmonary effort is normal. No respiratory distress.     Breath sounds: Normal breath sounds. No stridor. No wheezing or rales.  Abdominal:     General: Bowel sounds are normal. There is no distension.     Palpations: Abdomen is soft.     Tenderness: There is no abdominal tenderness. There is no guarding or rebound.  Musculoskeletal:        General: Tenderness present. No deformity.     Cervical back: Neck supple.     Comments: Tenderness palpation paraspinal region of thoracic spine as well as the costochondral region  Skin:    General: Skin is warm and dry.     Findings: No rash.  Neurological:     General: No focal deficit  present.     Mental Status: He is alert.     Cranial Nerves: No cranial nerve deficit (no facial droop, extraocular movements intact, no slurred speech).     Sensory: No sensory deficit.     Motor: No abnormal muscle tone or seizure activity.     Coordination: Coordination normal.     Comments: No facial droop, normal speech, normal strength and sensation bilateral upper and lower extremities  Psychiatric:        Mood and Affect: Mood normal.    ED Results / Procedures / Treatments   Labs (all labs ordered are listed, but only abnormal results are displayed) Labs Reviewed  BASIC METABOLIC PANEL - Abnormal; Notable for the following components:      Result Value   CO2 20 (*)    Calcium 8.8 (*)    All other components within normal limits  CBC - Abnormal; Notable for the following components:   RBC 3.94 (*)    Hemoglobin 12.4 (*)    HCT 36.9 (*)    All other components within normal limits    EKG EKG Interpretation  Date/Time:  Saturday July 13 2021 00:15:36 EDT Ventricular Rate:  106 PR Interval:  138 QRS Duration: 74 QT Interval:  326 QTC Calculation: 433 R Axis:   47 Text Interpretation: Sinus tachycardia Septal infarct , age undetermined Abnormal ECG No significant change since last tracing Confirmed by Linwood Dibbles [phone]) on 07/13/2021 7:49:29 AM  Radiology DG Chest 2 View  Result Date: 07/13/2021 CLINICAL DATA:  Chest pain EXAM: CHEST - 2 VIEW COMPARISON:  08/04/2016 FINDINGS: The heart size and mediastinal contours are within normal limits. Both lungs are clear. The visualized skeletal structures are unremarkable. IMPRESSION: No active cardiopulmonary disease. Electronically Signed   By: Kennith Center M.D.   On: 07/13/2021 07:58    Procedures Procedures   Medications Ordered in ED Medications - No data to display  ED Course  I have reviewed the triage vital signs and the nursing notes.  Pertinent labs & imaging results that were available during my care of  the patient were reviewed by me and considered in my medical decision making (see chart for details).  Clinical Course as of 07/13/21 0825  Sat Jul 13, 2021  0815 CXR without acute findings [JK]    Clinical Course User Index [JK] Linwood Dibbles, MD   MDM Rules/Calculators/A&P  Patient presented to the ED for evaluation of recurrent pain in his back and joints.  Patient also states he has been having seizure episodes.  Not entirely clear if patient is having tonic-clonic seizures at this point.  Patient states he has been referred as an outpatient but has not been able to schedule an appointment.  Patient is also complaining of aches and pains.  On exam having any findings to suggest infection.  There is no signs of fracture no signs of pneumonia.  Laboratory tests are otherwise unremarkable.  No signs of acute electrolyte abnormalities.  In the ED the patient is alert and oriented.  He has been taking Keppra but I will increase his dose.  I have placed an ambulatory referral to neurology.  Also recommend follow-up with a primary care doctor.  At this time there does not appear to be any evidence of an acute emergency medical condition and the patient appears stable for discharge with appropriate outpatient follow up.  Final Clinical Impression(s) / ED Diagnoses Final diagnoses:  None    Rx / DC Orders ED Discharge Orders          Ordered    Ambulatory referral to Neurology       Comments: An appointment is requested in approximately: 2 weeks   07/13/21 0822    levETIRAcetam (KEPPRA) 500 MG tablet        07/13/21 0822    methocarbamol (ROBAXIN) 500 MG tablet  2 times daily        07/13/21 0822    naproxen (NAPROSYN) 500 MG tablet  2 times daily with meals        07/13/21 7915             Linwood Dibbles, MD 07/13/21 [phone]

## 2021-07-13 NOTE — ED Notes (Signed)
Called pt for vitals, no response x1 

## 2021-07-13 NOTE — Discharge Instructions (Signed)
Increase your Keppra dose to 1000 mg in the morning and 500 mg in the evening.  You may need to increase this dose further.  Please follow-up with a neurologist for further evaluation.  Take the medications as needed for aches and pains.  Please follow-up with a primary care doctor for further evaluation

## 2021-07-13 NOTE — ED Triage Notes (Signed)
Pt c/o back pain from previous fall on 8/17.  Pt was seen 9/8 for same. Since then pt has continued to have seizures and "passing out" and falling multiple times. Pt c/o lower back pain and he is not able to sleep d/t pain. Pt states he called referred physicians, but has not been able to get follow-up appointments.

## 2021-08-25 ENCOUNTER — Emergency Department (HOSPITAL_COMMUNITY)
Admission: EM | Admit: 2021-08-25 | Discharge: 2021-08-26 | Disposition: A | Discharge status: Home / Self Care | Payer: Self-pay | Attending: Emergency Medicine | Admitting: Emergency Medicine

## 2021-08-25 ENCOUNTER — Other Ambulatory Visit: Payer: Self-pay

## 2021-08-25 DIAGNOSIS — M542 Cervicalgia: Secondary | ICD-10-CM | POA: Insufficient documentation

## 2021-08-25 DIAGNOSIS — F17200 Nicotine dependence, unspecified, uncomplicated: Secondary | ICD-10-CM | POA: Insufficient documentation

## 2021-08-25 DIAGNOSIS — M25531 Pain in right wrist: Secondary | ICD-10-CM | POA: Insufficient documentation

## 2021-08-25 DIAGNOSIS — G40909 Epilepsy, unspecified, not intractable, without status epilepticus: Secondary | ICD-10-CM | POA: Insufficient documentation

## 2021-08-25 NOTE — ED Notes (Signed)
Called pt for vitals x2 

## 2021-08-26 ENCOUNTER — Emergency Department (HOSPITAL_COMMUNITY): Payer: Self-pay

## 2021-08-26 ENCOUNTER — Other Ambulatory Visit: Payer: Self-pay

## 2021-08-26 ENCOUNTER — Encounter: Payer: Self-pay | Admitting: Neurology

## 2021-08-26 ENCOUNTER — Encounter (HOSPITAL_COMMUNITY): Payer: Self-pay | Admitting: Emergency Medicine

## 2021-08-26 LAB — COMPREHENSIVE METABOLIC PANEL
ALT: 21 U/L (ref 0–44)
AST: 38 U/L (ref 15–41)
Albumin: 4 g/dL (ref 3.5–5.0)
Alkaline Phosphatase: 82 U/L (ref 38–126)
Anion gap: 14 (ref 5–15)
BUN: 8 mg/dL (ref 6–20)
CO2: 21 mmol/L — ABNORMAL LOW (ref 22–32)
Calcium: 8.9 mg/dL (ref 8.9–10.3)
Chloride: 103 mmol/L (ref 98–111)
Creatinine, Ser: 0.85 mg/dL (ref 0.61–1.24)
GFR, Estimated: >60 mL/min (ref >60)
Glucose, Bld: 82 mg/dL (ref 70–99)
Potassium: 4.2 mmol/L (ref 3.5–5.1)
Sodium: 138 mmol/L (ref 135–145)
Total Bilirubin: 0.5 mg/dL (ref 0.3–1.2)
Total Protein: 7.1 g/dL (ref 6.5–8.1)

## 2021-08-26 LAB — CBC WITH DIFFERENTIAL/PLATELET
Abs Immature Granulocytes: 0.02 10*3/uL (ref 0.00–0.07)
Basophils Absolute: 0.1 10*3/uL (ref 0.0–0.1)
Basophils Relative: 1 %
Eosinophils Absolute: 0.1 10*3/uL (ref 0.0–0.5)
Eosinophils Relative: 1 %
HCT: 40.1 % (ref 39.0–52.0)
Hemoglobin: 13.7 g/dL (ref 13.0–17.0)
Immature Granulocytes: 0 %
Lymphocytes Relative: 42 %
Lymphs Abs: 3.6 10*3/uL (ref 0.7–4.0)
MCH: 31.7 pg (ref 26.0–34.0)
MCHC: 34.2 g/dL (ref 30.0–36.0)
MCV: 92.8 fL (ref 80.0–100.0)
Monocytes Absolute: 0.5 10*3/uL (ref 0.1–1.0)
Monocytes Relative: 6 %
Neutro Abs: 4.2 10*3/uL (ref 1.7–7.7)
Neutrophils Relative %: 50 %
Platelets: 149 10*3/uL — ABNORMAL LOW (ref 150–400)
RBC: 4.32 MIL/uL (ref 4.22–5.81)
RDW: 16.2 % — ABNORMAL HIGH (ref 11.5–15.5)
WBC: 8.5 10*3/uL (ref 4.0–10.5)
nRBC: 0 % (ref 0.0–0.2)

## 2021-08-26 LAB — CK: Total CK: 716 U/L — ABNORMAL HIGH (ref 49–397)

## 2021-08-26 MED ORDER — LEVETIRACETAM IN NACL 1500 MG/100ML IV SOLN
1500.0000 mg | Freq: Once | INTRAVENOUS | Status: AC
  Administered 2021-08-26: 1500 mg via INTRAVENOUS
  Filled 2021-08-26: qty 100

## 2021-08-26 NOTE — ED Provider Notes (Signed)
Emergency Medicine Provider Triage Evaluation Note  John Krueger , a 30 y.o. male  was evaluated in triage.  Pt complains of right arm pain onset yesterday.  Pt reports hx of seizures and 2 in the last 24 hours.  Patient reports after the last seizure he had some neck pain and right arm pain.  Reports it is worst in the right wrist.  Reports no missed doses of his Keppra.  Denies fevers or other infectious symptoms.  Review of Systems  Positive: Seizures, right arm pain, neck pain Negative: Open wounds  Physical Exam  BP (!) 129/100   Pulse 97   Temp 98.6 F (37 C) (Oral)   Resp 16   SpO2 99%  Gen:   Awake, no distress   Resp:  Normal effort  MSK:   Full range of motion of the right shoulder and right elbow.  Limited range of motion of the right wrist along with tenderness to palpation. Other:  Midline tenderness to the cervical spine without step-off or deformity.  No numbness, tingling or  Medical Decision Making  Medically screening exam initiated at 3:42 AM.  Appropriate orders placed.  [name] was informed that the remainder of the evaluation will be completed by another provider, this initial triage assessment does not replace that evaluation, and the importance of remaining in the ED until their evaluation is complete.  Seizures, arm pain, neck pain   Milta Deiters 08/26/21 0414    Gilda Crease, MD 08/26/21 [phone]

## 2021-08-26 NOTE — ED Provider Notes (Signed)
[name] EMERGENCY DEPARTMENT Provider Note   CSN: [id number] Arrival date & time: 08/25/21  2050     History Chief Complaint  Patient presents with   Seizures    John Krueger is a 30 y.o. male.  HPI  30 year old male with past medical history of epilepsy with breakthrough seizures presents the emergency department with concern for seizure-like activity and right wrist/neck pain.  Patient states over the last 2 days he has had 2 breakthrough seizures.  1 week resulted in right-sided neck pain and right wrist pain.  Patient currently does not have an outpatient neurologist.  States has been compliant with his Keppra but may have missed a couple doses over the last week.  Patient is here requesting information for a neurologist that he could follow with.  He has had no seizure-like activity since arriving here.  Denies any other acute illness or complaint.  Past Medical History:  Diagnosis Date   Seizures (HCC)     There are no problems to display for this patient.   History reviewed. No pertinent surgical history.     No family history on file.  Social History   Tobacco Use   Smoking status: Every Day   Smokeless tobacco: Never  Vaping Use   Vaping Use: Never used  Substance Use Topics   Alcohol use: No   Drug use: No    Home Medications Prior to Admission medications   Medication Sig Start Date End Date Taking? Authorizing Provider  doxycycline (VIBRAMYCIN) 100 MG capsule Take 1 capsule (100 mg total) by mouth 2 (two) times daily. One po bid x 7 days Patient not taking: Reported on 07/13/2021 10/26/20   Dartha Lodge, PA-C  levETIRAcetam (KEPPRA) 500 MG tablet Take 2 tablets (1,000 mg total) by mouth in the morning AND 1 tablet (500 mg total) every evening. 07/13/21 08/12/21  Linwood Dibbles, MD  methocarbamol (ROBAXIN) 500 MG tablet Take 2 tablets (1,000 mg total) by mouth 2 (two) times daily. 07/13/21   Linwood Dibbles, MD  naproxen (NAPROSYN) 500 MG  tablet Take 1 tablet (500 mg total) by mouth 2 (two) times daily with a meal. 07/13/21   Linwood Dibbles, MD  tiZANidine (ZANAFLEX) 2 MG tablet Take 1 tablet (2 mg total) by mouth every 8 (eight) hours as needed for muscle spasms. Patient not taking: Reported on 07/13/2021 07/05/21   Caccavale, Sophia, PA-C    Allergies    Patient has no known allergies.  Review of Systems   Review of Systems  Constitutional:  Negative for chills and fever.  HENT:  Negative for congestion.   Eyes:  Negative for visual disturbance.  Respiratory:  Negative for shortness of breath.   Cardiovascular:  Negative for chest pain.  Gastrointestinal:  Negative for abdominal pain, diarrhea and vomiting.  Genitourinary:  Negative for dysuria.  Musculoskeletal:  Positive for neck pain.       + right wrist pain  Skin:  Negative for rash.  Neurological:  Positive for seizures. Negative for weakness, numbness and headaches.   Physical Exam Updated Vital Signs BP (!) 123/94   Pulse 71   Temp 98.6 F (37 C) (Oral)   Resp 18   SpO2 96%   Physical Exam Vitals and nursing note reviewed.  Constitutional:      General: He is not in acute distress.    Appearance: Normal appearance. He is not ill-appearing.  HENT:     Head: Normocephalic.     Mouth/Throat:  Mouth: Mucous membranes are moist.  Cardiovascular:     Rate and Rhythm: Normal rate.  Pulmonary:     Effort: Pulmonary effort is normal. No respiratory distress.  Abdominal:     Palpations: Abdomen is soft.     Tenderness: There is no abdominal tenderness.  Musculoskeletal:        General: No swelling or deformity.     Cervical back: No rigidity.  Skin:    General: Skin is warm.  Neurological:     General: No focal deficit present.     Mental Status: He is alert and oriented to person, place, and time. Mental status is at baseline.     Motor: No weakness.  Psychiatric:        Mood and Affect: Mood normal.    ED Results / Procedures / Treatments    Labs (all labs ordered are listed, but only abnormal results are displayed) Labs Reviewed  CBC WITH DIFFERENTIAL/PLATELET - Abnormal; Notable for the following components:      Result Value   RDW 16.2 (*)    Platelets 149 (*)    All other components within normal limits  COMPREHENSIVE METABOLIC PANEL - Abnormal; Notable for the following components:   CO2 21 (*)    All other components within normal limits  CK - Abnormal; Notable for the following components:   Total CK 716 (*)    All other components within normal limits    EKG EKG Interpretation  Date/Time:  Monday August 26 2021 10:11:00 EDT Ventricular Rate:  63 PR Interval:  117 QRS Duration: 88 QT Interval:  419 QTC Calculation: 429 R Axis:   61 Text Interpretation: Sinus rhythm Borderline short PR interval LVH by voltage Similar to previous Confirmed by Lavenia Atlas [id number] on 08/26/2021 10:17:56 AM  Radiology DG Cervical Spine Complete  Result Date: 08/26/2021 CLINICAL DATA:  Pain after seizure. EXAM: CERVICAL SPINE - COMPLETE 4+ VIEW COMPARISON:  Cervical spine CT 06/12/2021 FINDINGS: There is no evidence of cervical spine fracture or prevertebral soft tissue swelling. Alignment is normal. No other significant bone abnormalities are identified. IMPRESSION: Negative cervical spine radiographs. Electronically Signed   By: Jorje Guild M.D.   On: 08/26/2021 04:35   DG Wrist Complete Right  Result Date: 08/26/2021 CLINICAL DATA:  Right wrist pain after seizure EXAM: RIGHT WRIST - COMPLETE 3+ VIEW COMPARISON:  03/09/2011 FINDINGS: There is no evidence of fracture or dislocation. There is no evidence of arthropathy or other focal bone abnormality. Soft tissues are unremarkable. IMPRESSION: Negative. Electronically Signed   By: Jorje Guild M.D.   On: 08/26/2021 04:36    Procedures Procedures   Medications Ordered in ED Medications  levETIRAcetam (KEPPRA) IVPB 1500 mg/ 100 mL premix (0 mg Intravenous Stopped  08/26/21 0426)    ED Course  I have reviewed the triage vital signs and the nursing notes.  Pertinent labs & imaging results that were available during my care of the patient were reviewed by me and considered in my medical decision making (see chart for details).    MDM Rules/Calculators/A&P                           30 year old male presents emergency department with concern for breakthrough seizures and no outpatient neurology follow-up.  States that he is been compliant with his Keppra with a couple missed doses.  Currently he is sitting in bed, eating and drinking.  His only complaint is right-sided  neck pain and right wrist pain stemming from a seizure he had a couple days ago.  X-rays are negative.  Blood work is reassuring, EKG is unchanged.  He had no further seizure-like activity the entire time that he has been here.  He is tolerating p.o., offers no new acute complaints.  I have placed an ambulatory referral to neurology for outpatient follow-up.  Patient was given an IV dose of Keppra here.  He does not need any refills.  Patient at this time appears safe and stable for discharge and will be treated as an outpatient.  Discharge plan and strict return to ED precautions discussed, patient verbalizes understanding and agreement.  Final Clinical Impression(s) / ED Diagnoses Final diagnoses:  Seizure disorder ([place])    Rx / DC Orders ED Discharge Orders          Ordered    Ambulatory referral to Neurology       Comments: An appointment is requested in approximately: 1 week, seizure medication management   08/26/21 [place], Alvin Critchley, DO 08/26/21 1055

## 2021-08-26 NOTE — Discharge Instructions (Signed)
You have been seen and discharged from the emergency department.  Establish care with neurology to have your seizure medication managed.  It is important that you are compliant with Keppra.  Follow-up with your primary provider for reevaluation and further care. Take home medications as prescribed. If you have any worsening symptoms or further concerns for your health please return to an emergency department for further evaluation.

## 2021-08-26 NOTE — ED Triage Notes (Signed)
Patient reports seizures x2 yesterday morning , alert and oriented /respirations unlabored . Patient added right forearm muscle pain .

## 2021-09-04 ENCOUNTER — Other Ambulatory Visit: Payer: Self-pay

## 2021-09-04 ENCOUNTER — Ambulatory Visit (INDEPENDENT_AMBULATORY_CARE_PROVIDER_SITE_OTHER): Payer: Self-pay | Admitting: Neurology

## 2021-09-04 ENCOUNTER — Encounter: Payer: Self-pay | Admitting: Neurology

## 2021-09-04 VITALS — BP 143/90 | HR 92 | Resp 18 | Ht 65.0 in | Wt 171.0 lb

## 2021-09-04 DIAGNOSIS — G40009 Localization-related (focal) (partial) idiopathic epilepsy and epileptic syndromes with seizures of localized onset, not intractable, without status epilepticus: Secondary | ICD-10-CM

## 2021-09-04 MED ORDER — LEVETIRACETAM 1000 MG PO TABS
1000.0000 mg | ORAL_TABLET | Freq: Two times a day (BID) | ORAL | 11 refills | Status: DC
  Filled 2021-09-04 – 2021-10-07 (×2): qty 60, 30d supply, fill #0

## 2021-09-04 NOTE — Progress Notes (Signed)
NEUROLOGY CONSULTATION NOTE  John Krueger MRN: [id number] DOB: September 17, 1991  Referring provider: Dr. Coralee Pesa (ER) Primary care provider: none listed  Reason for consult:  seizures  Dear Dr Wilkie Aye:  Thank you for your kind referral of John Krueger for consultation of the above symptoms. Although his history is well known to you, please allow me to reiterate it for the purpose of our medical record. The patient was accompanied to the clinic by his friend (declined to provide her name) who also provides collateral information. Records and images were personally reviewed where available.   HISTORY OF PRESENT ILLNESS: This is a 30 year old right-handed man presenting for evaluation of seizures. He is accompanied by his friend who helps supplement the history today. He is a poor historian, having difficulty clarifying answers and saying "I can't answer that" when further clarification is asked. ER notes were reviewed, he has had multiple ER visits for seizures and pain. The first episode occurred on 03/05/20 while on night shift at the local [employer], he was taking a smoking break and started feeling different and dizzy. His co-workers reported he was unresponsive for 2 minutes no motor activity noted. He was amnestic of the event. 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 Since then, he has had several ER visits for seizures. He reports symptoms start from the back of his neck, his lower back and ribs burn, he cannot move his right arm and leg, and he feels his body vibrate before he loses consciousness. Sometimes he is able to stop the symptoms when he tries to get up and would not lose consciousness. He denies any focal weakness/numbness/tingling after the seizures. He cannot talk and would not remember anything afterwards. He has bitten his tongue and had urinary  incontinence with them. It appears he was event-free for a year until 03/12/21 while in the court house sitting on the bench next to his attorney when he had a tonic-clonic seizure lasting 20 seconds, post-ictal when EMS arrived. He was started on Levetiracetam 500mg  BID at that time. He was in the ER 10 days later for muscle pain, CK was 1128. He had ER visits for seizures on 04/26/21, 06/12/21 (while in jail, lasting 30 seconds), 07/12/21 (dose increased to 1000mg  in AM, 500mg  in PM but he comes today taking 500mg  BID), and most recently 08/25/21 when he reported 2 seizures in 2 days. He may have missed medication the week prior. His CK level was 716. He is concerned about the seizures, but also very concerned about his pain. He is constantly complaining of back pain. He has constipation, no incontinence. He has difficulty sleeping, when the pain is worse, he has to walk around. He has difficulty saying if he has any olfactory/gustatory hallucinations. He forgets his age, his friend notes "there is something wrong with his memory." He lives with his mother. They note he had a rough life with many head injuries, no neurosurgical procedures. He had a normal birth and early development.  There is no history of febrile convulsions, CNS infections such as meningitis/encephalitis, or family history of seizures   PAST MEDICAL HISTORY: Past Medical History:  Diagnosis Date   Seizures (HCC)     PAST SURGICAL HISTORY: History reviewed. No pertinent surgical history.  MEDICATIONS: Current Outpatient Medications on File Prior to Visit  Medication Sig Dispense  Refill   doxycycline (VIBRAMYCIN) 100 MG capsule Take 1 capsule (100 mg total) by mouth 2 (two) times daily. One po bid x 7 days (Patient not taking: Reported on 07/13/2021) 14 capsule 0   levETIRAcetam (KEPPRA) 500 MG tablet Take 2 tablets (1,000 mg total) by mouth in the morning AND 1 tablet (500 mg total) every evening. 90 tablet 0   methocarbamol (ROBAXIN)  500 MG tablet Take 2 tablets (1,000 mg total) by mouth 2 (two) times daily. 20 tablet 0   naproxen (NAPROSYN) 500 MG tablet Take 1 tablet (500 mg total) by mouth 2 (two) times daily with a meal. 14 tablet 0   tiZANidine (ZANAFLEX) 2 MG tablet Take 1 tablet (2 mg total) by mouth every 8 (eight) hours as needed for muscle spasms. (Patient not taking: Reported on 07/13/2021) 15 tablet 0   No current facility-administered medications on file prior to visit.    ALLERGIES: No Known Allergies  FAMILY HISTORY: History reviewed. No pertinent family history.  SOCIAL HISTORY: Social History   Socioeconomic History   Marital status: Single    Spouse name: Not on file   Number of children: Not on file   Years of education: Not on file   Highest education level: Not on file  Occupational History   Not on file  Tobacco Use   Smoking status: Every Day   Smokeless tobacco: Never  Vaping Use   Vaping Use: Never used  Substance and Sexual Activity   Alcohol use: No   Drug use: No   Sexual activity: Not on file  Other Topics Concern   Not on file  Social History Narrative   Not on file   Social Determinants of Health   Financial Resource Strain: Not on file  Food Insecurity: Not on file  Transportation Needs: Not on file  Physical Activity: Not on file  Stress: Not on file  Social Connections: Not on file  Intimate Partner Violence: Not on file     PHYSICAL EXAM: Vitals:   09/04/21 0915  BP: (!) 143/90  Pulse: 92  Resp: 18  SpO2: 98%   General: No acute distress Head:  Normocephalic/atraumatic Skin/Extremities: No rash, no edema Neurological Exam: Mental status: alert and oriented to person, place, and time, no dysarthria or aphasia, Fund of knowledge is reduced.  Recent and remote memory are impaired. Attention and concentration are reduced, he is focused on his pain. Cranial nerves: CN I: not tested CN II: pupils equal, round and reactive to light, visual fields  intact CN III, IV, VI:  full range of motion, no nystagmus, no ptosis CN V: facial sensation intact CN VII: upper and lower face symmetric CN VIII: hearing intact to conversation Bulk & Tone: normal, no fasciculations. Motor: 5/5 throughout with no pronator drift. Sensation: intact to light touch, cold, pin, vibration sense.  No extinction to double simultaneous stimulation.  Romberg test negative Deep Tendon Reflexes: +2 throughout Cerebellar: no incoordination on finger to nose testing Gait: narrow-based and steady, able to tandem walk adequately. Tremor: none   IMPRESSION: This is a 30 year old right-handed man with a history of recurrent seizures since 02/2020. He is a poor historian and currently focused on body pains. Initial seizure suggestive of focal impaired awareness seizure, since then he has had generalized convulsions with significant body pains and elevated CK levels. Etiology of seizures unknown, possibly left hemisphere onset. Head CTs have been normal. The body pains are significantly impacting his daily activities, he has  pain even without the seizures. Last seizure was 08/25/2021. We discussed the importance of taking his Levetiracetam 1000mg  BID regularly and use a pillbox to ensure compliance. He will apply for [facility] so we can proceed with brain MRI and EEG. Resources for medication assistance were provided today. He was advised to establish care with a PCP for chronic pain. [place] driving laws were discussed with the patient, and he knows to stop driving after a seizure, until 6 months seizure-free. Follow-up in 3 months, they know to call for any changes.     Thank you for allowing me to participate in the care of this patient. Please do not hesitate to call for any questions or concerns.   [facility], M.D.  CC: Dr. Patrcia Dolly

## 2021-09-04 NOTE — Patient Instructions (Signed)
Prescription for Keppra (Levetiracetam) 1000mg : Take 1 tablet twice a day has been [facility] to [facility]. You can also check with [facility] for price difference.  2. Start using a pillbox to ensure you take medications regularly twice a day  3. Please apply for [facility] and we will plan to do an EEG (brain wave test) and brain MRI when able  4. Please contact the [facility] to help you with medications, support, and guidance for avenues for work Local: 4696999054 Email: efnc@[facility] .edu Helpline: (800) (322) 025-4270  5. Establish care with a primary care doctor to help with back pain, body pains. You can try [phone] and [facility] on [address] [place] (tel [phone])  6. Follow-up in 3 months, call for any changes.   Seizure Precautions: 1. If medication has been prescribed for you to prevent seizures, take it exactly as directed.  Do not stop taking the medicine without talking to your doctor first, even if you have not had a seizure in a long time.   2. Avoid activities in which a seizure would cause danger to yourself or to others.  Don't operate dangerous machinery, swim alone, or climb in high or dangerous places, such as on ladders, roofs, or girders.  Do not drive unless your doctor says you may.  3. If you have any warning that you may have a seizure, lay down in a safe place where you can't hurt yourself.    4.  No driving for 6 months from last seizure, as per [facility].   Please refer to the following link on the [employer]'s website for more information: http://www.epilepsyfoundation.org/answerplace/Social/driving/drivingu.cfm   5.  Maintain good sleep hygiene. Avoid alcohol.  6.  Contact your doctor if you have any problems that may be related to the medicine you are taking.  7.  Call 911 and bring the patient back to the ED if:        A.  The seizure lasts longer  than 5 minutes.       B.  The patient doesn't awaken shortly after the seizure  C.  The patient has new problems such as difficulty seeing, speaking or moving  D.  The patient was injured during the seizure  E.  The patient has a temperature over 102 F (39C)  F.  The patient vomited and now is having trouble breathing

## 2021-09-11 ENCOUNTER — Other Ambulatory Visit: Payer: Self-pay

## 2021-10-04 ENCOUNTER — Other Ambulatory Visit: Payer: Self-pay

## 2021-10-07 ENCOUNTER — Other Ambulatory Visit: Payer: Self-pay

## 2021-10-08 ENCOUNTER — Other Ambulatory Visit: Payer: Self-pay

## 2021-10-14 ENCOUNTER — Other Ambulatory Visit: Payer: Self-pay

## 2021-12-13 ENCOUNTER — Ambulatory Visit: Payer: Self-pay | Admitting: Neurology

## 2021-12-13 DIAGNOSIS — Z029 Encounter for administrative examinations, unspecified: Secondary | ICD-10-CM

## 2021-12-16 ENCOUNTER — Encounter: Payer: Self-pay | Admitting: Neurology

## 2021-12-24 ENCOUNTER — Emergency Department (HOSPITAL_COMMUNITY): Payer: Self-pay

## 2021-12-24 ENCOUNTER — Encounter (HOSPITAL_COMMUNITY): Payer: Self-pay | Admitting: Emergency Medicine

## 2021-12-24 ENCOUNTER — Other Ambulatory Visit (HOSPITAL_COMMUNITY): Payer: Self-pay

## 2021-12-24 ENCOUNTER — Emergency Department (HOSPITAL_COMMUNITY)
Admission: EM | Admit: 2021-12-24 | Discharge: 2021-12-24 | Disposition: A | Discharge status: Home / Self Care | Payer: Self-pay | Attending: Student | Admitting: Student

## 2021-12-24 DIAGNOSIS — W01198A Fall on same level from slipping, tripping and stumbling with subsequent striking against other object, initial encounter: Secondary | ICD-10-CM | POA: Insufficient documentation

## 2021-12-24 DIAGNOSIS — D696 Thrombocytopenia, unspecified: Secondary | ICD-10-CM | POA: Insufficient documentation

## 2021-12-24 DIAGNOSIS — H5712 Ocular pain, left eye: Secondary | ICD-10-CM | POA: Insufficient documentation

## 2021-12-24 DIAGNOSIS — F17200 Nicotine dependence, unspecified, uncomplicated: Secondary | ICD-10-CM | POA: Insufficient documentation

## 2021-12-24 DIAGNOSIS — R569 Unspecified convulsions: Secondary | ICD-10-CM | POA: Insufficient documentation

## 2021-12-24 DIAGNOSIS — R519 Headache, unspecified: Secondary | ICD-10-CM | POA: Insufficient documentation

## 2021-12-24 LAB — BASIC METABOLIC PANEL
Anion gap: 12 (ref 5–15)
BUN: 5 mg/dL — ABNORMAL LOW (ref 6–20)
CO2: 24 mmol/L (ref 22–32)
Calcium: 9.8 mg/dL (ref 8.9–10.3)
Chloride: 98 mmol/L (ref 98–111)
Creatinine, Ser: 1 mg/dL (ref 0.61–1.24)
GFR, Estimated: >60 mL/min (ref >60)
Glucose, Bld: 113 mg/dL — ABNORMAL HIGH (ref 70–99)
Potassium: 3.5 mmol/L (ref 3.5–5.1)
Sodium: 134 mmol/L — ABNORMAL LOW (ref 135–145)

## 2021-12-24 LAB — CBC
HCT: 41.6 % (ref 39.0–52.0)
Hemoglobin: 14.3 g/dL (ref 13.0–17.0)
MCH: 32 pg (ref 26.0–34.0)
MCHC: 34.4 g/dL (ref 30.0–36.0)
MCV: 93.1 fL (ref 80.0–100.0)
Platelets: 120 10*3/uL — ABNORMAL LOW (ref 150–400)
RBC: 4.47 MIL/uL (ref 4.22–5.81)
RDW: 14.9 % (ref 11.5–15.5)
WBC: 4.8 10*3/uL (ref 4.0–10.5)
nRBC: 0 % (ref 0.0–0.2)

## 2021-12-24 LAB — CBG MONITORING, ED: Glucose-Capillary: 80 mg/dL (ref 70–99)

## 2021-12-24 MED ORDER — DIPHENHYDRAMINE HCL 50 MG/ML IJ SOLN
25.0000 mg | Freq: Once | INTRAMUSCULAR | Status: AC
  Administered 2021-12-24: 25 mg via INTRAVENOUS
  Filled 2021-12-24: qty 1

## 2021-12-24 MED ORDER — LEVETIRACETAM IN NACL 1500 MG/100ML IV SOLN
1500.0000 mg | Freq: Once | INTRAVENOUS | Status: AC
  Administered 2021-12-24: 1500 mg via INTRAVENOUS
  Filled 2021-12-24 (×2): qty 100

## 2021-12-24 MED ORDER — LEVETIRACETAM 1000 MG PO TABS
1000.0000 mg | ORAL_TABLET | Freq: Two times a day (BID) | ORAL | 11 refills | Status: DC
  Filled 2021-12-24: qty 60, 30d supply, fill #0

## 2021-12-24 MED ORDER — LACTATED RINGERS IV BOLUS
1000.0000 mL | Freq: Once | INTRAVENOUS | Status: AC
  Administered 2021-12-24: 1000 mL via INTRAVENOUS

## 2021-12-24 MED ORDER — LEVETIRACETAM 250 MG PO TABS
250.0000 mg | ORAL_TABLET | Freq: Two times a day (BID) | ORAL | 11 refills | Status: DC
  Filled 2021-12-24: qty 60, 30d supply, fill #0

## 2021-12-24 MED ORDER — PROCHLORPERAZINE EDISYLATE 10 MG/2ML IJ SOLN
10.0000 mg | Freq: Once | INTRAMUSCULAR | Status: AC
  Administered 2021-12-24: 10 mg via INTRAVENOUS
  Filled 2021-12-24: qty 2

## 2021-12-24 NOTE — ED Triage Notes (Signed)
Patient here with complaint of right sided headaches and lead to a loss of consciousness and him falling and hitting his head on the ground. Patient states he has been out of [place] for two months, note from neurology appointment in November 2022 state he was applying for financial assistance program but patient states he has been told by assistance program that they can only assist him paying for medications and will not pay for them entirely. Patient has been out of work for two years now due to being unable to drive due to seizures. Patient currently alert, oriented, and in no apparent distress at this time.

## 2021-12-24 NOTE — ED Provider Triage Note (Signed)
Emergency Medicine Provider Triage Evaluation Note  John Krueger , a 31 y.o. male  was evaluated in triage.  Pt complains of right-sided headaches leading to loss of consciousness for 2 years.  Patient initially denied seizures, but upon chart review saw the patient was seen by neurology in November 2022 and was put on Keppra for seizures.  He states that he was applying for financial assistance for this medication but was told that they cannot pay for them entirely.  He has been out of his Keppra for 2 months.  He has been out of work for 2 years as he is unable to drive due to seizures.  He states that he is having seizure about once a week as he describes as a "pain in his brain that leads him to fall out".  Review of Systems  As above  Physical Exam  BP (!) 122/102 (BP Location: Right Arm)    Pulse (!) 113    Temp 98.3 F (36.8 C) (Oral)    Resp 18    SpO2 98%  Gen:   Awake, no distress   Resp:  Normal effort  MSK:   Moves extremities without difficulty  Other:    Medical Decision Making  Medically screening exam initiated at 9:12 AM.  Appropriate orders placed.  John Krueger was informed that the remainder of the evaluation will be completed by another provider, this initial triage assessment does not replace that evaluation, and the importance of remaining in the ED until their evaluation is complete.     Su Monks, [place] 12/24/21 7829

## 2021-12-24 NOTE — ED Provider Notes (Signed)
[name] EMERGENCY DEPARTMENT Provider Note  CSN: [id number] Arrival date & time: 12/24/21 1308  Chief Complaint(s) Seizures  HPI John Krueger is a 31 y.o. male with PMH seizure disorder on Keppra who presents emergency department for evaluation of headache and breakthrough seizures.  Patient states that he has been compliant with his Keppra, and took his medications yesterday and this morning but has had intermittent headaches that start in his back and shoot to the back of his left-sided occiput.  He states that the headache was so severe and localized behind the left eye that he fell and struck his head on the ground.  He then proceeded to have a seizure.  He has since returned to normal mental status baseline and denies chest pain, shortness of breath, abdominal pain, nausea, vomiting or other systemic symptoms.  Endorses persistent headache.   Seizures  Past Medical History Past Medical History:  Diagnosis Date   Seizures (HCC)    There are no problems to display for this patient.  Home Medication(s) Prior to Admission medications   Medication Sig Start Date End Date Taking? Authorizing Provider  doxycycline (VIBRAMYCIN) 100 MG capsule Take 1 capsule (100 mg total) by mouth 2 (two) times daily. One po bid x 7 days Patient not taking: Reported on 07/13/2021 10/26/20   Dartha Lodge, PA-C  levETIRAcetam (KEPPRA) 1000 MG tablet Take 1 tablet (1,000 mg total) by mouth 2 (two) times daily. 09/04/21   Van Clines, MD  methocarbamol (ROBAXIN) 500 MG tablet Take 2 tablets (1,000 mg total) by mouth 2 (two) times daily. 07/13/21   Linwood Dibbles, MD  naproxen (NAPROSYN) 500 MG tablet Take 1 tablet (500 mg total) by mouth 2 (two) times daily with a meal. 07/13/21   Linwood Dibbles, MD  tiZANidine (ZANAFLEX) 2 MG tablet Take 1 tablet (2 mg total) by mouth every 8 (eight) hours as needed for muscle spasms. Patient not taking: No sig reported 07/05/21   Caccavale, Sophia, PA-C                                                                                                                                     Past Surgical History No past surgical history on file. Family History No family history on file.  Social History Social History   Tobacco Use   Smoking status: Every Day   Smokeless tobacco: Never  Vaping Use   Vaping Use: Never used  Substance Use Topics   Alcohol use: No   Drug use: No   Allergies Patient has no known allergies.  Review of Systems Review of Systems  Neurological:  Positive for seizures and headaches.   Physical Exam Vital Signs  I have reviewed the triage vital signs BP (!) 128/97    Pulse (!) 103    Temp 98.3 F (36.8 C) (Oral)    Resp 14    SpO2 98%  Physical Exam Vitals and nursing note reviewed.  Constitutional:      General: He is not in acute distress.    Appearance: He is well-developed.  HENT:     Head: Normocephalic and atraumatic.  Eyes:     Conjunctiva/sclera: Conjunctivae normal.  Cardiovascular:     Rate and Rhythm: Normal rate and regular rhythm.     Heart sounds: No murmur heard. Pulmonary:     Effort: Pulmonary effort is normal. No respiratory distress.     Breath sounds: Normal breath sounds.  Abdominal:     Palpations: Abdomen is soft.     Tenderness: There is no abdominal tenderness.  Musculoskeletal:        General: No swelling.     Cervical back: Neck supple.  Skin:    General: Skin is warm and dry.     Capillary Refill: Capillary refill takes less than 2 seconds.  Neurological:     Mental Status: He is alert.     Cranial Nerves: No cranial nerve deficit.     Sensory: No sensory deficit.     Motor: No weakness.  Psychiatric:        Mood and Affect: Mood normal.    ED Results and Treatments Labs (all labs ordered are listed, but only abnormal results are displayed) Labs Reviewed  CBC - Abnormal; Notable for the following components:      Result Value   Platelets 120 (*)    All other  components within normal limits  BASIC METABOLIC PANEL - Abnormal; Notable for the following components:   Sodium 134 (*)    Glucose, Bld 113 (*)    BUN 5 (*)    All other components within normal limits  CBG MONITORING, ED                                                                                                                          Radiology No results found.  Pertinent labs & imaging results that were available during my care of the patient were reviewed by me and considered in my medical decision making (see MDM for details).  Medications Ordered in ED Medications  levETIRAcetam (KEPPRA) IVPB 1500 mg/ 100 mL premix (has no administration in time range)  prochlorperazine (COMPAZINE) injection 10 mg (10 mg Intravenous Given 12/24/21 1108)  diphenhydrAMINE (BENADRYL) injection 25 mg (25 mg Intravenous Given 12/24/21 1108)  lactated ringers bolus 1,000 mL (1,000 mLs Intravenous New Bag/Given 12/24/21 1107)  Procedures Procedures  (including critical care time)  Medical Decision Making / ED Course   This patient presents to the ED for concern of headache, seizure, this involves an extensive number of treatment options, and is a complaint that carries with it a high risk of complications and morbidity.  The differential diagnosis includes medication noncompliance, migraine headache, failure of outpatient medication regimen, ICH electrolyte abnormality  MDM: Patient seen emergency department for evaluation of breakthrough seizures.  Physical exam is unremarkable with an unremarkable neurologic exam.  Laboratory evaluation with mild thrombocytopenia to 120 but is otherwise unremarkable.  CT head unremarkable.  Patient loaded with Keppra and neurology consulted who recommended increasing Keppra to 1250 twice daily.  Headache cocktail given for headache.   Patient has significant issues from a financial standpoint getting this medication filled and thus we will do meds to bed and deliver the Keppra to the patient prior to discharge.  Patient will follow-up outpatient with neurology.   Additional history obtained:  -External records from outside source obtained and reviewed including: Chart review including previous notes, labs, imaging, consultation notes   Lab Tests: -I ordered, reviewed, and interpreted labs.   The pertinent results include:   Labs Reviewed  CBC - Abnormal; Notable for the following components:      Result Value   Platelets 120 (*)    All other components within normal limits  BASIC METABOLIC PANEL - Abnormal; Notable for the following components:   Sodium 134 (*)    Glucose, Bld 113 (*)    BUN 5 (*)    All other components within normal limits  CBG MONITORING, ED      EKG   EKG Interpretation  Date/Time:  Tuesday December 24 2021 09:00:56 EST Ventricular Rate:  115 PR Interval:  154 QRS Duration: 86 QT Interval:  320 QTC Calculation: 442 R Axis:   68 Text Interpretation: Sinus tachycardia When compared with ECG of 26-Aug-2021 10:11, PREVIOUS ECG IS PRESENT Confirmed by Infiniti Hoefling ([id number]) on 12/24/2021 12:33:38 PM         Imaging Studies ordered: I ordered imaging studies including CT head I independently visualized and interpreted imaging. I agree with the radiologist interpretation   Medicines ordered and prescription drug management: Meds ordered this encounter  Medications   levETIRAcetam (KEPPRA) IVPB 1500 mg/ 100 mL premix   prochlorperazine (COMPAZINE) injection 10 mg   diphenhydrAMINE (BENADRYL) injection 25 mg   lactated ringers bolus 1,000 mL    -I have reviewed the patients home medicines and have made adjustments as needed  Critical interventions None  Consultations Obtained: I requested consultation with the neurology,  and discussed lab and imaging findings as well as  pertinent plan - they recommend: Increasing Keppra to 1250 twice daily   Cardiac Monitoring: The patient was maintained on a cardiac monitor.  I personally viewed and interpreted the cardiac monitored which showed an underlying rhythm of: Sinus tachycardia, normal sinus rhythm  Social Determinants of Health:  Factors impacting patients care include: Difficulty affording medication   Reevaluation: After the interventions noted above, I reevaluated the patient and found that they have :improved  Co morbidities that complicate the patient evaluation  Past Medical History:  Diagnosis Date   Seizures (HCC)       Dispostion: I considered admission for this patient, but patient is safe for outpatient follow-up.  Symptoms have resolved.  No additional seizure activity.     Final Clinical Impression(s) / ED Diagnoses Final diagnoses:  None     @  Charlyne PetrinPCDICTATION@    Calden Dorsey, MD 12/24/21 1234

## 2022-01-01 ENCOUNTER — Other Ambulatory Visit (HOSPITAL_COMMUNITY): Payer: Self-pay

## 2022-02-22 ENCOUNTER — Other Ambulatory Visit: Payer: Self-pay

## 2022-02-22 ENCOUNTER — Emergency Department (HOSPITAL_COMMUNITY)
Admission: EM | Admit: 2022-02-22 | Discharge: 2022-02-23 | Disposition: A | Discharge status: Home / Self Care | Payer: Medicaid Other | Attending: Emergency Medicine | Admitting: Emergency Medicine

## 2022-02-22 ENCOUNTER — Encounter (HOSPITAL_COMMUNITY): Payer: Self-pay | Admitting: Emergency Medicine

## 2022-02-22 DIAGNOSIS — K649 Unspecified hemorrhoids: Secondary | ICD-10-CM

## 2022-02-22 DIAGNOSIS — R042 Hemoptysis: Secondary | ICD-10-CM | POA: Insufficient documentation

## 2022-02-22 DIAGNOSIS — K625 Hemorrhage of anus and rectum: Secondary | ICD-10-CM | POA: Insufficient documentation

## 2022-02-22 DIAGNOSIS — R7401 Elevation of levels of liver transaminase levels: Secondary | ICD-10-CM | POA: Insufficient documentation

## 2022-02-22 DIAGNOSIS — R7989 Other specified abnormal findings of blood chemistry: Secondary | ICD-10-CM

## 2022-02-22 DIAGNOSIS — D696 Thrombocytopenia, unspecified: Secondary | ICD-10-CM | POA: Insufficient documentation

## 2022-02-22 NOTE — ED Triage Notes (Addendum)
Pt reported to ED with c/o coughing up blood x 7 months. States he also has blood in stools has been ongoing for 8 months. Denies any other problems at this time.  ?

## 2022-02-23 ENCOUNTER — Emergency Department (HOSPITAL_COMMUNITY): Payer: Medicaid Other

## 2022-02-23 ENCOUNTER — Encounter (HOSPITAL_COMMUNITY): Payer: Self-pay | Admitting: Emergency Medicine

## 2022-02-23 LAB — CBC
HCT: 40 % (ref 39.0–52.0)
Hemoglobin: 13.4 g/dL (ref 13.0–17.0)
MCH: 31.5 pg (ref 26.0–34.0)
MCHC: 33.5 g/dL (ref 30.0–36.0)
MCV: 94.1 fL (ref 80.0–100.0)
Platelets: 141 10*3/uL — ABNORMAL LOW (ref 150–400)
RBC: 4.25 MIL/uL (ref 4.22–5.81)
RDW: 14.2 % (ref 11.5–15.5)
WBC: 5.8 10*3/uL (ref 4.0–10.5)
nRBC: 0 % (ref 0.0–0.2)

## 2022-02-23 LAB — COMPREHENSIVE METABOLIC PANEL
ALT: 94 U/L — ABNORMAL HIGH (ref 0–44)
AST: 199 U/L — ABNORMAL HIGH (ref 15–41)
Albumin: 4.1 g/dL (ref 3.5–5.0)
Alkaline Phosphatase: 91 U/L (ref 38–126)
Anion gap: 11 (ref 5–15)
BUN: <5 mg/dL — ABNORMAL LOW (ref 6–20)
CO2: 24 mmol/L (ref 22–32)
Calcium: 8.9 mg/dL (ref 8.9–10.3)
Chloride: 101 mmol/L (ref 98–111)
Creatinine, Ser: 0.82 mg/dL (ref 0.61–1.24)
GFR, Estimated: >60 mL/min (ref >60)
Glucose, Bld: 76 mg/dL (ref 70–99)
Potassium: 3.6 mmol/L (ref 3.5–5.1)
Sodium: 136 mmol/L (ref 135–145)
Total Bilirubin: 0.5 mg/dL (ref 0.3–1.2)
Total Protein: 7.9 g/dL (ref 6.5–8.1)

## 2022-02-23 LAB — TYPE AND SCREEN
ABO/RH(D): O POS
Antibody Screen: NEGATIVE

## 2022-02-23 LAB — POC OCCULT BLOOD, ED: Fecal Occult Bld: NEGATIVE

## 2022-02-23 NOTE — ED Notes (Signed)
E-signature pad unavailable at time of pt discharge. This RN discussed discharge materials with pt and answered all pt questions. Pt stated understanding of discharge material. ? ?

## 2022-02-23 NOTE — ED Provider Notes (Addendum)
?[facility] ?Provider Note ? ? ?CSN: [id number] ?Arrival date & time: 02/22/22  2151 ? ?  ? ?History ? ?Chief Complaint  ?Patient presents with  ? Hemoptysis  ? Rectal Bleeding  ? ? ?John Krueger is a 31 y.o. male. ? ?The history is provided by the patient.  ?Rectal Bleeding ?Quality:  Bright red ?Amount:  Scant ?Duration:  8 months ?Timing:  Intermittent ?Chronicity:  Chronic ?Context: constipation   ?Relieved by:  Nothing ?Worsened by:  Nothing ?Ineffective treatments:  None tried ?Associated symptoms: no abdominal pain and no fever   ?Risk factors: no anticoagulant use   ?Also occasionally has pink sputum or a streak of blood if he coughs a lot for the same amount of time.  No CP, no SOB.  No travel. No surgeries.   ?  ? ?Home Medications ?Prior to Admission medications   ?Medication Sig Start Date End Date Taking? Authorizing Provider  ?levETIRAcetam (KEPPRA) 1000 MG tablet Take 1 tablet (1,000 mg total) by mouth 2 (two) times daily. 12/24/21   Kommor, Madison, MD  ?levETIRAcetam (KEPPRA) 250 MG tablet Take 1 tablet (250 mg total) by mouth 2 (two) times daily. 12/24/21 01/23/22  Kommor, Madison, MD  ?methocarbamol (ROBAXIN) 500 MG tablet Take 2 tablets (1,000 mg total) by mouth 2 (two) times daily. 07/13/21   Dorie Rank, MD  ?naproxen (NAPROSYN) 500 MG tablet Take 1 tablet (500 mg total) by mouth 2 (two) times daily with a meal. 07/13/21   Dorie Rank, MD  ?tiZANidine (ZANAFLEX) 2 MG tablet Take 1 tablet (2 mg total) by mouth every 8 (eight) hours as needed for muscle spasms. ?Patient not taking: No sig reported 07/05/21   Caccavale, Sophia, PA-C  ?   ? ?Allergies    ?Patient has no known allergies.   ? ?Review of Systems   ?Review of Systems  ?Constitutional:  Negative for fever.  ?HENT:  Negative for congestion.   ?Respiratory:  Negative for shortness of breath, wheezing and stridor.   ?Cardiovascular:  Negative for chest pain, palpitations and leg swelling.  ?Gastrointestinal:   Positive for anal bleeding and hematochezia. Negative for abdominal pain.  ?All other systems reviewed and are negative. ? ?Physical Exam ?Updated Vital Signs ?BP 110/65 (BP Location: Right Arm)   Pulse 67   Temp 98 ?F (36.7 ?C) (Oral)   Resp 18   SpO2 96%  ?Physical Exam ?Vitals and nursing note reviewed. Exam conducted with a chaperone present.  ?Constitutional:   ?   General: He is not in acute distress. ?   Appearance: Normal appearance.  ?HENT:  ?   Head: Normocephalic and atraumatic.  ?   Nose: Nose normal.  ?Eyes:  ?   Conjunctiva/sclera: Conjunctivae normal.  ?   Pupils: Pupils are equal, round, and reactive to light.  ?Cardiovascular:  ?   Rate and Rhythm: Normal rate and regular rhythm.  ?   Pulses: Normal pulses.  ?   Heart sounds: Normal heart sounds.  ?Pulmonary:  ?   Effort: Pulmonary effort is normal.  ?   Breath sounds: Normal breath sounds.  ?Abdominal:  ?   General: Abdomen is flat. Bowel sounds are normal.  ?   Palpations: Abdomen is soft.  ?   Tenderness: There is no abdominal tenderness. There is no guarding or rebound.  ?   Hernia: No hernia is present.  ?Genitourinary: ?   Rectum: Guaiac result negative.  ?   Comments: Small hemorrhoid ?Musculoskeletal:     ?  General: Normal range of motion.  ?   Cervical back: Normal range of motion and neck supple.  ?Skin: ?   General: Skin is warm and dry.  ?   Capillary Refill: Capillary refill takes less than 2 seconds.  ?Neurological:  ?   General: No focal deficit present.  ?   Mental Status: He is alert and oriented to person, place, and time.  ?Psychiatric:     ?   Mood and Affect: Mood normal.     ?   Behavior: Behavior normal.  ? ? ?ED Results / Procedures / Treatments   ?Labs ?(all labs ordered are listed, but only abnormal results are displayed) ?Results for orders placed or performed during the hospital encounter of 02/22/22  ?Comprehensive metabolic panel  ?Result Value Ref Range  ? Sodium 136 135 - 145 mmol/L  ? Potassium 3.6 3.5 - 5.1  mmol/L  ? Chloride 101 98 - 111 mmol/L  ? CO2 24 22 - 32 mmol/L  ? Glucose, Bld 76 70 - 99 mg/dL  ? BUN <5 (L) 6 - 20 mg/dL  ? Creatinine, Ser 0.82 0.61 - 1.24 mg/dL  ? Calcium 8.9 8.9 - 10.3 mg/dL  ? Total Protein 7.9 6.5 - 8.1 g/dL  ? Albumin 4.1 3.5 - 5.0 g/dL  ? AST 199 (H) 15 - 41 U/L  ? ALT 94 (H) 0 - 44 U/L  ? Alkaline Phosphatase 91 38 - 126 U/L  ? Total Bilirubin 0.5 0.3 - 1.2 mg/dL  ? GFR, Estimated >60 >60 mL/min  ? Anion gap 11 5 - 15  ?CBC  ?Result Value Ref Range  ? WBC 5.8 4.0 - 10.5 K/uL  ? RBC 4.25 4.22 - 5.81 MIL/uL  ? Hemoglobin 13.4 13.0 - 17.0 g/dL  ? HCT 40.0 39.0 - 52.0 %  ? MCV 94.1 80.0 - 100.0 fL  ? MCH 31.5 26.0 - 34.0 pg  ? MCHC 33.5 30.0 - 36.0 g/dL  ? RDW 14.2 11.5 - 15.5 %  ? Platelets 141 (L) 150 - 400 K/uL  ? nRBC 0.0 0.0 - 0.2 %  ?POC occult blood, ED  ?Result Value Ref Range  ? Fecal Occult Bld NEGATIVE NEGATIVE  ?Type and screen [place]  ?Result Value Ref Range  ? ABO/RH(D) O POS   ? Antibody Screen NEG   ? Sample Expiration    ?  02/25/2022,2359 ?Performed at [facility], [place] [address], [place], [place] [address] ?  ? ?No results found. ? ?Radiology ?No results found. ? ?Procedures ?Procedures  ? ? ?Medications Ordered in ED ?Medications - No data to display ? ?ED Course/ Medical Decision Making/ A&P ?  ?                        ?Medical Decision Making ?Occasional blood in sputum with cough for 8 months and rectal rectal with hard stool for same peroid of time  ? ?Amount and/or Complexity of Data Reviewed ?External Data Reviewed: notes. ?   Details: previous ED notes reviewed. ?Labs: ordered. ?   Details: all labs reviewed:  hemoccult negative.  Normal hemoglobin 13.4, platelets slightly low at 141,000.  Normal electrolytes elevatedhepatic function AST > ALT ?Radiology: ordered. ?   Details: negative CXR by my reading. ? ?Risk ?Risk Details: Bleeding likely secondary to straining with BM.  I suspect more spitting up blood.  LFTs elevated and I will  refer to GI for this.  I do not think this is a PE.  There is no pain, no SOB.  No leg pain.  No surgeries or travel.  Patient needs a PMD.  Will refer to community health and wellness.   ? ? ? ?Final Clinical Impression(s) / ED Diagnoses ?Final diagnoses:  ?None  ? ?Return for intractable cough, coughing up blood, fevers > 100.4 unrelieved by medication, shortness of breath, intractable vomiting, chest pain, shortness of breath, weakness, numbness, changes in speech, facial asymmetry, abdominal pain, passing out, Inability to tolerate liquids or food, cough, altered mental status or any concerns. No signs of systemic illness or infection. The patient is nontoxic-appearing on exam and vital signs are within normal limits.  ?I have reviewed the triage vital signs and the nursing notes. Pertinent labs & imaging results that were available during my care of the patient were reviewed by me and considered in my medical decision making (see chart for details). After history, exam, and medical workup I feel the patient has been appropriately medically screened and is safe for discharge home. Pertinent diagnoses were discussed with the patient. Patient was given return precautions. ?  ?  ?Rx / DC Orders ?ED Discharge Orders   ? ? None  ? ?  ? ? ?  ?Lynnix Schoneman, MD ?02/23/22 [id number] ? ?  ?Jihan Mellette, MD ?02/23/22 [id number] ? ?

## 2022-03-09 ENCOUNTER — Other Ambulatory Visit: Payer: Self-pay

## 2022-03-09 ENCOUNTER — Encounter (HOSPITAL_COMMUNITY): Payer: Self-pay

## 2022-03-09 ENCOUNTER — Emergency Department (HOSPITAL_COMMUNITY)
Admission: EM | Admit: 2022-03-09 | Discharge: 2022-03-10 | Disposition: A | Discharge status: Home / Self Care | Payer: Medicaid Other | Attending: Emergency Medicine | Admitting: Emergency Medicine

## 2022-03-09 DIAGNOSIS — Z20822 Contact with and (suspected) exposure to covid-19: Secondary | ICD-10-CM | POA: Insufficient documentation

## 2022-03-09 DIAGNOSIS — M546 Pain in thoracic spine: Secondary | ICD-10-CM | POA: Insufficient documentation

## 2022-03-09 DIAGNOSIS — R7401 Elevation of levels of liver transaminase levels: Secondary | ICD-10-CM | POA: Insufficient documentation

## 2022-03-09 DIAGNOSIS — E876 Hypokalemia: Secondary | ICD-10-CM | POA: Insufficient documentation

## 2022-03-09 DIAGNOSIS — M791 Myalgia, unspecified site: Secondary | ICD-10-CM | POA: Insufficient documentation

## 2022-03-09 DIAGNOSIS — R509 Fever, unspecified: Secondary | ICD-10-CM | POA: Insufficient documentation

## 2022-03-09 DIAGNOSIS — R519 Headache, unspecified: Secondary | ICD-10-CM | POA: Insufficient documentation

## 2022-03-09 DIAGNOSIS — Z79899 Other long term (current) drug therapy: Secondary | ICD-10-CM | POA: Insufficient documentation

## 2022-03-09 DIAGNOSIS — M79604 Pain in right leg: Secondary | ICD-10-CM | POA: Insufficient documentation

## 2022-03-09 DIAGNOSIS — R059 Cough, unspecified: Secondary | ICD-10-CM | POA: Insufficient documentation

## 2022-03-09 LAB — RAPID URINE DRUG SCREEN, HOSP PERFORMED
Amphetamines: NOT DETECTED
Barbiturates: NOT DETECTED
Benzodiazepines: NOT DETECTED
Cocaine: NOT DETECTED
Opiates: NOT DETECTED
Tetrahydrocannabinol: NOT DETECTED

## 2022-03-09 LAB — COMPREHENSIVE METABOLIC PANEL
ALT: 63 U/L — ABNORMAL HIGH (ref 0–44)
AST: 107 U/L — ABNORMAL HIGH (ref 15–41)
Albumin: 4 g/dL (ref 3.5–5.0)
Alkaline Phosphatase: 82 U/L (ref 38–126)
Anion gap: 11 (ref 5–15)
BUN: 6 mg/dL (ref 6–20)
CO2: 23 mmol/L (ref 22–32)
Calcium: 9.6 mg/dL (ref 8.9–10.3)
Chloride: 102 mmol/L (ref 98–111)
Creatinine, Ser: 0.89 mg/dL (ref 0.61–1.24)
GFR, Estimated: >60 mL/min (ref >60)
Glucose, Bld: 110 mg/dL — ABNORMAL HIGH (ref 70–99)
Potassium: 3.1 mmol/L — ABNORMAL LOW (ref 3.5–5.1)
Sodium: 136 mmol/L (ref 135–145)
Total Bilirubin: 0.6 mg/dL (ref 0.3–1.2)
Total Protein: 7.2 g/dL (ref 6.5–8.1)

## 2022-03-09 LAB — CBC WITH DIFFERENTIAL/PLATELET
Abs Immature Granulocytes: 0.01 10*3/uL (ref 0.00–0.07)
Basophils Absolute: 0 10*3/uL (ref 0.0–0.1)
Basophils Relative: 1 %
Eosinophils Absolute: 0.1 10*3/uL (ref 0.0–0.5)
Eosinophils Relative: 1 %
HCT: 36.6 % — ABNORMAL LOW (ref 39.0–52.0)
Hemoglobin: 12.5 g/dL — ABNORMAL LOW (ref 13.0–17.0)
Immature Granulocytes: 0 %
Lymphocytes Relative: 39 %
Lymphs Abs: 2.1 10*3/uL (ref 0.7–4.0)
MCH: 31.8 pg (ref 26.0–34.0)
MCHC: 34.2 g/dL (ref 30.0–36.0)
MCV: 93.1 fL (ref 80.0–100.0)
Monocytes Absolute: 0.4 10*3/uL (ref 0.1–1.0)
Monocytes Relative: 7 %
Neutro Abs: 2.8 10*3/uL (ref 1.7–7.7)
Neutrophils Relative %: 52 %
Platelets: 129 10*3/uL — ABNORMAL LOW (ref 150–400)
RBC: 3.93 MIL/uL — ABNORMAL LOW (ref 4.22–5.81)
RDW: 14.4 % (ref 11.5–15.5)
WBC: 5.4 10*3/uL (ref 4.0–10.5)
nRBC: 0 % (ref 0.0–0.2)

## 2022-03-09 LAB — URINALYSIS, ROUTINE W REFLEX MICROSCOPIC
Glucose, UA: NEGATIVE mg/dL
Hgb urine dipstick: NEGATIVE
Ketones, ur: NEGATIVE mg/dL
Leukocytes,Ua: NEGATIVE
Nitrite: NEGATIVE
Protein, ur: 30 mg/dL — AB
Specific Gravity, Urine: 1.031 — ABNORMAL HIGH (ref 1.005–1.030)
pH: 5 (ref 5.0–8.0)

## 2022-03-09 NOTE — ED Triage Notes (Signed)
Patient arrives with brother POV c/o generalized body aches that started today. Patient reports having a fever today but did not use a thermometer to check it. Pt reports a headache and states that when he is about to have a seizure "he experiences the same body aches." Pt states that he takes his seizure medications daily. ?

## 2022-03-09 NOTE — ED Provider Triage Note (Signed)
Emergency Medicine Provider Triage Evaluation Note ? ?John Krueger , a 31 y.o. male  was evaluated in triage.  Pt complains of concern for possible seizure soon.  He says before he gets a seizure his muscles hurt. His muscles have been hurting since 3am.  He hasn't had a seizure recently per his report.  He denies any fevers.  ? ?He reports compliance with his medications ? ?Physical Exam  ?BP (!) 136/109   Pulse 94   Temp 98.9 ?F (37.2 ?C) (Oral)   Resp 16   Ht 5\' 5"  (1.651 m)   Wt 86.2 kg   SpO2 98%   BMI 31.62 kg/m?  ?Gen:   Awake, no distress   ?Resp:  Normal effort  ?MSK:   Moves extremities without difficulty  ?Other:  Normal speech.  ? ?Medical Decision Making  ?Medically screening exam initiated at 8:56 PM.  Appropriate orders placed.  was informed that the remainder of the evaluation will be completed by another provider, this initial triage assessment does not replace that evaluation, and the importance of remaining in the ED until their evaluation is complete. ? ? ?  ?John Roger, PA-C ?03/09/22 2058 ? ?

## 2022-03-10 ENCOUNTER — Other Ambulatory Visit (HOSPITAL_COMMUNITY): Payer: Self-pay

## 2022-03-10 LAB — RESP PANEL BY RT-PCR (FLU A&B, COVID) ARPGX2
Influenza A by PCR: NEGATIVE
Influenza B by PCR: NEGATIVE
SARS Coronavirus 2 by RT PCR: NEGATIVE

## 2022-03-10 MED ORDER — LEVETIRACETAM 750 MG PO TABS
1250.0000 mg | ORAL_TABLET | Freq: Once | ORAL | Status: AC
  Administered 2022-03-10: 1250 mg via ORAL
  Filled 2022-03-10: qty 1

## 2022-03-10 MED ORDER — LEVETIRACETAM 1000 MG PO TABS
1000.0000 mg | ORAL_TABLET | Freq: Two times a day (BID) | ORAL | 11 refills | Status: DC
  Filled 2022-03-10: qty 60, 30d supply, fill #0

## 2022-03-10 MED ORDER — LEVETIRACETAM 250 MG PO TABS
250.0000 mg | ORAL_TABLET | Freq: Two times a day (BID) | ORAL | 11 refills | Status: DC
  Filled 2022-03-10: qty 60, 30d supply, fill #0

## 2022-03-10 MED ORDER — POTASSIUM CHLORIDE CRYS ER 20 MEQ PO TBCR
40.0000 meq | EXTENDED_RELEASE_TABLET | Freq: Once | ORAL | Status: AC
  Administered 2022-03-10: 40 meq via ORAL
  Filled 2022-03-10: qty 2

## 2022-03-10 NOTE — Discharge Instructions (Addendum)
You were seen in the emergency department today for muscle pains and seizure yesterday. We gave you a dose of your Keppra and refilled your prescriptions here. You labs look normal. I have sent an urgent referral to Dr. Karel Jarvis, your neurologist, to have a follow-up appointment in the next 1-2 weeks. If they do not call you, please give them a call in 2 days to set up an appointment. I have put her information in your discharge paperwork. Please return for worsening symptoms or more frequent seizures.  ?

## 2022-03-10 NOTE — ED Provider Notes (Signed)
?[name] ?Provider Note ? ? ?CSN: [id number] ?Arrival date & time: 03/09/22  2022 ? ?  ? ?History ? ?Chief Complaint  ?Patient presents with  ? Generalized Body Aches  ? ? ?John Krueger is a 31 y.o. male. With past medical history of seizure disorder who presents to the emergency department with generalized body aches. ? ?States yesterday 03/09/2022 at 3 AM he began having right-sided muscular pain that began down in his lower leg and proceeded to spread to his upper leg, right side, right upper back and head.  He states that he then had a seizure yesterday at 3 AM.  He states that he also had 3 episodes of vomiting yesterday.  States seizure prior to this was 2 weeks ago.  States that he has been taking his Keppra twice daily as prescribed.  States that he presents here because he is almost out of his Keppra.  He is instructed to his Keppra.  Because his difficulty affording it.  Additionally, he states he had a tactile fever yesterday.  Endorsing cough in the evening.  Denies shortness of breath, rhinorrhea, sore throat. ? ?Of note, he was seen on 12/24/21 for seizure. He was loaded with Keppra, consulted neurology who increased Keppra to 1250mg  BID, was to have OP follow-up which I do not see. Prior to that he was seen by Dr. Karel JarvisAquino, neurology on 09/04/21. Reported that seizures began initially 02/2020. She who recommended EEG, MRI. I do not see that either of these were completed.  He states that he does not remember much about his neurology appointment or that he is supposed to have either of these imaging modalities. ? ?HPI ? ?  ? ?Home Medications ?Prior to Admission medications   ?Medication Sig Start Date End Date Taking? Authorizing Provider  ?levETIRAcetam (KEPPRA) 1000 MG tablet Take 1 tablet (1,000 mg total) by mouth 2 (two) times daily. 12/24/21   Kommor, Madison, MD  ?levETIRAcetam (KEPPRA) 250 MG tablet Take 1 tablet (250 mg total) by mouth 2 (two) times daily.  12/24/21 02/23/22  Kommor, Madison, MD  ?methocarbamol (ROBAXIN) 500 MG tablet Take 2 tablets (1,000 mg total) by mouth 2 (two) times daily. ?Patient taking differently: Take 1,000 mg by mouth 2 (two) times daily as needed for muscle spasms. 07/13/21   Linwood DibblesKnapp, Jon, MD  ?naproxen (NAPROSYN) 500 MG tablet Take 1 tablet (500 mg total) by mouth 2 (two) times daily with a meal. ?Patient not taking: Reported on 02/23/2022 07/13/21   Linwood DibblesKnapp, Jon, MD  ?tiZANidine (ZANAFLEX) 2 MG tablet Take 1 tablet (2 mg total) by mouth every 8 (eight) hours as needed for muscle spasms. ?Patient not taking: No sig reported 07/05/21   Caccavale, Sophia, PA-C  ?   ? ?Allergies    ?Patient has no known allergies.   ? ?Review of Systems   ?Review of Systems  ?Constitutional:  Positive for fever.  ?Respiratory:  Positive for cough.   ?Musculoskeletal:  Positive for myalgias.  ?Neurological:  Positive for seizures.  ?All other systems reviewed and are negative. ? ?Physical Exam ?Updated Vital Signs ?BP (!) 130/110   Pulse 70   Temp 98.9 ?F (37.2 ?C) (Oral)   Resp 18   Ht 5\' 5"  (1.651 m)   Wt 86.2 kg   SpO2 98%   BMI 31.62 kg/m?  ?Physical Exam ?Vitals and nursing note reviewed.  ?Constitutional:   ?   General: He is not in acute distress. ?   Appearance: Normal  appearance. He is normal weight. He is not ill-appearing or toxic-appearing.  ?HENT:  ?   Head: Normocephalic and atraumatic.  ?   Mouth/Throat:  ?   Mouth: Mucous membranes are moist.  ?   Pharynx: Oropharynx is clear.  ?Eyes:  ?   General: No scleral icterus. ?   Extraocular Movements: Extraocular movements intact.  ?   Pupils: Pupils are equal, round, and reactive to light.  ?Cardiovascular:  ?   Rate and Rhythm: Normal rate and regular rhythm.  ?   Pulses: Normal pulses.  ?   Heart sounds: No murmur heard. ?Pulmonary:  ?   Effort: Pulmonary effort is normal. No respiratory distress.  ?Abdominal:  ?   General: Bowel sounds are normal.  ?   Palpations: Abdomen is soft.  ?Musculoskeletal:      ?   General: Tenderness present. Normal range of motion.  ?   Cervical back: Neck supple. No rigidity.  ?Skin: ?   General: Skin is warm and dry.  ?   Capillary Refill: Capillary refill takes less than 2 seconds.  ?Neurological:  ?   General: No focal deficit present.  ?   Mental Status: He is alert and oriented to person, place, and time. Mental status is at baseline.  ?   GCS: GCS eye subscore is 4. GCS verbal subscore is 5. GCS motor subscore is 6.  ?   Cranial Nerves: Cranial nerves 2-12 are intact. No cranial nerve deficit.  ?   Sensory: Sensation is intact.  ?   Motor: Motor function is intact. No weakness.  ?Psychiatric:     ?   Mood and Affect: Mood normal.     ?   Behavior: Behavior normal.     ?   Thought Content: Thought content normal.     ?   Judgment: Judgment normal.  ? ? ?ED Results / Procedures / Treatments   ?Labs ?(all labs ordered are listed, but only abnormal results are displayed) ?Labs Reviewed  ?COMPREHENSIVE METABOLIC PANEL - Abnormal; Notable for the following components:  ?    Result Value  ? Potassium 3.1 (*)   ? Glucose, Bld 110 (*)   ? AST 107 (*)   ? ALT 63 (*)   ? All other components within normal limits  ?CBC WITH DIFFERENTIAL/PLATELET - Abnormal; Notable for the following components:  ? RBC 3.93 (*)   ? Hemoglobin 12.5 (*)   ? HCT 36.6 (*)   ? Platelets 129 (*)   ? All other components within normal limits  ?URINALYSIS, ROUTINE W REFLEX MICROSCOPIC - Abnormal; Notable for the following components:  ? Color, Urine AMBER (*)   ? APPearance HAZY (*)   ? Specific Gravity, Urine 1.031 (*)   ? Bilirubin Urine SMALL (*)   ? Protein, ur 30 (*)   ? Bacteria, UA RARE (*)   ? All other components within normal limits  ?RESP PANEL BY RT-PCR (FLU A&B, COVID) ARPGX2  ?RAPID URINE DRUG SCREEN, HOSP PERFORMED  ?CBG MONITORING, ED  ? ?EKG ?None ? ?Radiology ?No results found. ? ?Procedures ?Procedures  ? ?Medications Ordered in ED ?Medications  ?levETIRAcetam (KEPPRA) tablet 1,250 mg (1,250 mg  Oral Given 03/10/22 0834)  ?potassium chloride SA (KLOR-CON M) CR tablet 40 mEq (40 mEq Oral Given 03/10/22 0834)  ? ?ED Course/ Medical Decision Making/ A&P ?  ?                        ?  Medical Decision Making ?Risk ?Prescription drug management. ? ?This patient presents to the ED for concern of generalized body aches, this involves an extensive number of treatment options, and is a complaint that carries with it a high risk of complications and morbidity.  The differential diagnosis includes viral illness, electrolyte abnormality, rhabdmyolysis, medication side effect, seizure prodrome, etc.  ? ?Co morbidities that complicate the patient evaluation ?Seizure disorder  ? ?Additional history obtained:  ?Additional history obtained from: ?External records from outside source obtained and reviewed including: Most recent ED physician note, Neurology physician note from 09/04/21  ? ?EKG: ?Not indicated ? ?Cardiac Monitoring: ?Not indicated ? ?Lab Results: ?I personally ordered, reviewed, and interpreted labs. ?Pertinent results include: ?CMP with K 3.1, elevated AST>ALT appears chronic  ?CBC with hgb 12.5, chronic. Platelets 129, chronic  ?UA without UTI  ?UDS negative  ?Covid and flu negative ? ?Imaging Studies ordered:  ?Not indicated ? ?Medications  ?I ordered medication including Keppra for routine medication, missing dose  ?Reevaluation of the patient after medication shows that patient  n/a ?-I reviewed the patient's home medications and did not make adjustments. ?-I did not prescribe new home medications. ? ?Tests Considered: ?MRI, EEG as it appears this was to be done per outpatient neurology.  Will rerefer patient urgently to neurology to have this completed.  He is at baseline neurological status.  Not currently having seizure. ? ?Critical Interventions: ?None required ? ?Consultations: ?None required ? ?SDH ?Difficulty affording medication - consulted TOC  ? ?ED Course: ? ?31 year old male who presents emergency  department with complaint of seizure yesterday.  He is still having ongoing myalgias on the right side which appears to be his normal presentation around his seizure.  He is not currently postictal.  He has a nor

## 2022-03-10 NOTE — ED Notes (Signed)
Not responding to be roomed  ?

## 2022-03-11 ENCOUNTER — Other Ambulatory Visit (HOSPITAL_COMMUNITY): Payer: Self-pay

## 2022-04-30 IMAGING — DX DG CHEST 1V PORT
1 series · 1 of 1 positions shown · non-contrast
Comparison: 07/13/2021

CLINICAL DATA: Rectal bleeding with hemoptysis and cough.

EXAM:
PORTABLE CHEST 1 VIEW

[chest ap]
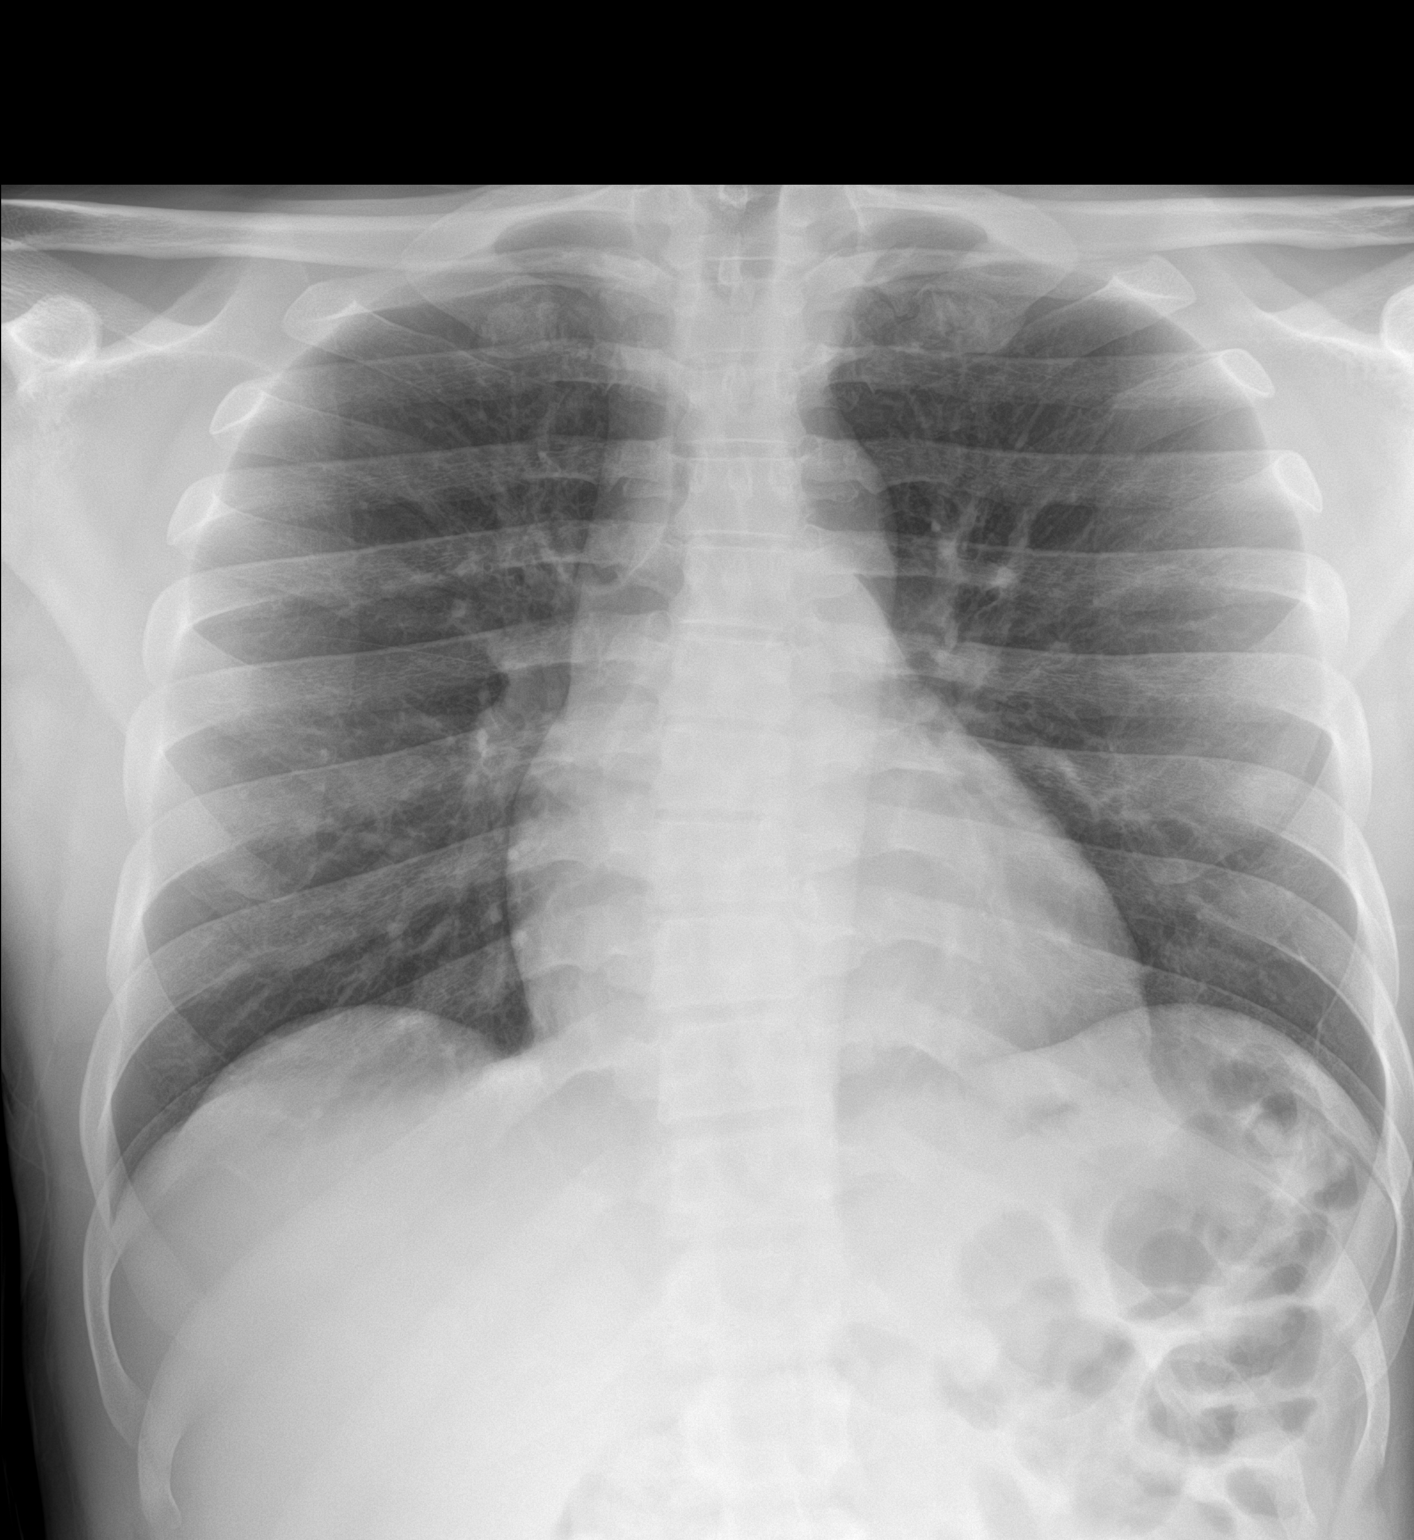

[1 of 1 positions shown; findings below may reference images not displayed]

FINDINGS: 2218 hours. The lungs are clear without focal pneumonia, edema,
pneumothorax or pleural effusion. The cardiopericardial silhouette
is within normal limits for size. The visualized bony structures of
the thorax are unremarkable.
IMPRESSION: No active disease.

## 2022-08-05 ENCOUNTER — Emergency Department (HOSPITAL_COMMUNITY)
Admission: EM | Admit: 2022-08-05 | Discharge: 2022-08-06 | Discharge status: Against Medical Advice | Payer: Medicaid Other | Attending: Student | Admitting: Student

## 2022-08-05 ENCOUNTER — Encounter (HOSPITAL_COMMUNITY): Payer: Self-pay | Admitting: *Deleted

## 2022-08-05 ENCOUNTER — Other Ambulatory Visit: Payer: Self-pay

## 2022-08-05 DIAGNOSIS — R059 Cough, unspecified: Secondary | ICD-10-CM | POA: Insufficient documentation

## 2022-08-05 DIAGNOSIS — R1110 Vomiting, unspecified: Secondary | ICD-10-CM | POA: Insufficient documentation

## 2022-08-05 DIAGNOSIS — Z20822 Contact with and (suspected) exposure to covid-19: Secondary | ICD-10-CM | POA: Insufficient documentation

## 2022-08-05 DIAGNOSIS — Z5321 Procedure and treatment not carried out due to patient leaving prior to being seen by health care provider: Secondary | ICD-10-CM | POA: Insufficient documentation

## 2022-08-05 DIAGNOSIS — R569 Unspecified convulsions: Secondary | ICD-10-CM | POA: Insufficient documentation

## 2022-08-05 DIAGNOSIS — R519 Headache, unspecified: Secondary | ICD-10-CM | POA: Insufficient documentation

## 2022-08-05 LAB — RESP PANEL BY RT-PCR (FLU A&B, COVID) ARPGX2
Influenza A by PCR: NEGATIVE
Influenza B by PCR: NEGATIVE
SARS Coronavirus 2 by RT PCR: NEGATIVE

## 2022-08-05 LAB — CBC WITH DIFFERENTIAL/PLATELET
Abs Immature Granulocytes: 0.02 10*3/uL (ref 0.00–0.07)
Basophils Absolute: 0 10*3/uL (ref 0.0–0.1)
Basophils Relative: 1 %
Eosinophils Absolute: 0.1 10*3/uL (ref 0.0–0.5)
Eosinophils Relative: 1 %
HCT: 38.3 % — ABNORMAL LOW (ref 39.0–52.0)
Hemoglobin: 13.2 g/dL (ref 13.0–17.0)
Immature Granulocytes: 0 %
Lymphocytes Relative: 44 %
Lymphs Abs: 2.5 10*3/uL (ref 0.7–4.0)
MCH: 31.7 pg (ref 26.0–34.0)
MCHC: 34.5 g/dL (ref 30.0–36.0)
MCV: 92.1 fL (ref 80.0–100.0)
Monocytes Absolute: 0.5 10*3/uL (ref 0.1–1.0)
Monocytes Relative: 9 %
Neutro Abs: 2.6 10*3/uL (ref 1.7–7.7)
Neutrophils Relative %: 45 %
Platelets: 165 10*3/uL (ref 150–400)
RBC: 4.16 MIL/uL — ABNORMAL LOW (ref 4.22–5.81)
RDW: 14.9 % (ref 11.5–15.5)
WBC: 5.7 10*3/uL (ref 4.0–10.5)
nRBC: 0 % (ref 0.0–0.2)

## 2022-08-05 LAB — BASIC METABOLIC PANEL
Anion gap: 12 (ref 5–15)
BUN: 5 mg/dL — ABNORMAL LOW (ref 6–20)
CO2: 24 mmol/L (ref 22–32)
Calcium: 9.4 mg/dL (ref 8.9–10.3)
Chloride: 106 mmol/L (ref 98–111)
Creatinine, Ser: 0.64 mg/dL (ref 0.61–1.24)
GFR, Estimated: >60 mL/min (ref >60)
Glucose, Bld: 75 mg/dL (ref 70–99)
Potassium: 3.5 mmol/L (ref 3.5–5.1)
Sodium: 142 mmol/L (ref 135–145)

## 2022-08-05 NOTE — ED Provider Triage Note (Signed)
Emergency Medicine Provider Triage Evaluation Note  John Krueger , a 31 y.o. male  was evaluated in triage.  Pt complains of 2 seizures within the past 24 hours.  Patient had a seizure yesterday and today.  Patient has a history of seizures.  Compliant with his Keppra.  He admits to cough and posttussis emesis. He also endorses a headache  Review of Systems  Positive: seizure Negative: fever  Physical Exam  BP 124/87 (BP Location: Right Arm)   Pulse 92   Temp 97.9 F (36.6 C) (Oral)   Resp 20   SpO2 98%  Gen:   Awake, no distress   Resp:  Normal effort  MSK:   Moves extremities without difficulty  Other:    Medical Decision Making  Medically screening exam initiated at 8:27 PM.  Appropriate orders placed.  Len Blalock was informed that the remainder of the evaluation will be completed by another provider, this initial triage assessment does not replace that evaluation, and the importance of remaining in the ED until their evaluation is complete.  Labs CT in 11/2021 unremarkable   Karie Kirks 08/05/22 2028

## 2022-08-05 NOTE — ED Triage Notes (Addendum)
Pt states he has hx of seizures, takes keppra as prescribed. Pt reporting he had a headache yesterday and had a seizure yesterday and today while at home.  Pt says that he has had a cough that he coughs so hard he vomits.  Alert and oriented in triage.

## 2022-08-05 NOTE — ED Notes (Signed)
Pt called multiple times no answer 

## 2022-08-06 NOTE — ED Notes (Signed)
Called for vitals no response

## 2022-08-06 NOTE — ED Notes (Signed)
Pt called x3 times, no response.

## 2022-08-18 ENCOUNTER — Emergency Department (HOSPITAL_COMMUNITY)
Admission: EM | Admit: 2022-08-18 | Discharge: 2022-08-19 | Disposition: A | Discharge status: Home / Self Care | Payer: Self-pay | Attending: Emergency Medicine | Admitting: Emergency Medicine

## 2022-08-18 ENCOUNTER — Encounter (HOSPITAL_COMMUNITY): Payer: Self-pay | Admitting: Emergency Medicine

## 2022-08-18 ENCOUNTER — Other Ambulatory Visit: Payer: Self-pay

## 2022-08-18 ENCOUNTER — Emergency Department (HOSPITAL_COMMUNITY): Payer: Self-pay

## 2022-08-18 DIAGNOSIS — G40909 Epilepsy, unspecified, not intractable, without status epilepticus: Secondary | ICD-10-CM | POA: Insufficient documentation

## 2022-08-18 DIAGNOSIS — K089 Disorder of teeth and supporting structures, unspecified: Secondary | ICD-10-CM | POA: Insufficient documentation

## 2022-08-18 DIAGNOSIS — M25561 Pain in right knee: Secondary | ICD-10-CM | POA: Insufficient documentation

## 2022-08-18 DIAGNOSIS — R0789 Other chest pain: Secondary | ICD-10-CM | POA: Insufficient documentation

## 2022-08-18 DIAGNOSIS — E876 Hypokalemia: Secondary | ICD-10-CM | POA: Insufficient documentation

## 2022-08-18 DIAGNOSIS — M542 Cervicalgia: Secondary | ICD-10-CM | POA: Insufficient documentation

## 2022-08-18 DIAGNOSIS — Z20822 Contact with and (suspected) exposure to covid-19: Secondary | ICD-10-CM | POA: Insufficient documentation

## 2022-08-18 DIAGNOSIS — M25562 Pain in left knee: Secondary | ICD-10-CM | POA: Insufficient documentation

## 2022-08-18 DIAGNOSIS — R569 Unspecified convulsions: Secondary | ICD-10-CM

## 2022-08-18 LAB — COMPREHENSIVE METABOLIC PANEL
ALT: 30 U/L (ref 0–44)
AST: 74 U/L — ABNORMAL HIGH (ref 15–41)
Albumin: 4 g/dL (ref 3.5–5.0)
Alkaline Phosphatase: 84 U/L (ref 38–126)
Anion gap: 17 — ABNORMAL HIGH (ref 5–15)
BUN: <5 mg/dL — ABNORMAL LOW (ref 6–20)
CO2: 19 mmol/L — ABNORMAL LOW (ref 22–32)
Calcium: 9.6 mg/dL (ref 8.9–10.3)
Chloride: 101 mmol/L (ref 98–111)
Creatinine, Ser: 0.7 mg/dL (ref 0.61–1.24)
GFR, Estimated: >60 mL/min (ref >60)
Glucose, Bld: 109 mg/dL — ABNORMAL HIGH (ref 70–99)
Potassium: 2.8 mmol/L — ABNORMAL LOW (ref 3.5–5.1)
Sodium: 137 mmol/L (ref 135–145)
Total Bilirubin: 0.4 mg/dL (ref 0.3–1.2)
Total Protein: 7.4 g/dL (ref 6.5–8.1)

## 2022-08-18 LAB — CBC
HCT: 39.4 % (ref 39.0–52.0)
Hemoglobin: 13.9 g/dL (ref 13.0–17.0)
MCH: 32.4 pg (ref 26.0–34.0)
MCHC: 35.3 g/dL (ref 30.0–36.0)
MCV: 91.8 fL (ref 80.0–100.0)
Platelets: 136 10*3/uL — ABNORMAL LOW (ref 150–400)
RBC: 4.29 MIL/uL (ref 4.22–5.81)
RDW: 14.9 % (ref 11.5–15.5)
WBC: 4.7 10*3/uL (ref 4.0–10.5)
nRBC: 0 % (ref 0.0–0.2)

## 2022-08-18 NOTE — ED Triage Notes (Addendum)
Patient reports seizure episode at home this afternoon , alert and oriented at arrival/respirations unlabored , he adds mild headache and generalized body aches . He did not miss his ( Keppra) seizure medication .

## 2022-08-18 NOTE — ED Provider Triage Note (Signed)
Emergency Medicine Provider Triage Evaluation Note  John Krueger , a 31 y.o. male  was evaluated in triage.  Pt complains of seizure. States he has a hx of seizures. States 3 years ago he had his first seizure. Happened at home. Uncle was with him when he had the seizure. States he has seizures frequently. Sometimes as often as 2 in a week but also as far apart as 2 in a month.   States he takes keppra no missed doses.  Review of Systems  Positive: Seizure Negative: Fever   Physical Exam  BP (!) 125/99 (BP Location: Right Arm)   Pulse (!) 104   Temp 98.7 F (37.1 C) (Oral)   Resp 18   SpO2 99%  Gen:   Awake, no distress   Resp:  Normal effort  MSK:   Moves extremities without difficulty  Other:  Smile symmetric, moves all four extremities.   Medical Decision Making  Medically screening exam initiated at 8:34 PM.  Appropriate orders placed.  Len Blalock was informed that the remainder of the evaluation will be completed by another provider, this initial triage assessment does not replace that evaluation, and the importance of remaining in the ED until their evaluation is complete.  [id number]   Pati Gallo [place], [place] 08/18/22 2036

## 2022-08-19 ENCOUNTER — Emergency Department (HOSPITAL_COMMUNITY): Payer: Self-pay

## 2022-08-19 ENCOUNTER — Other Ambulatory Visit (HOSPITAL_COMMUNITY): Payer: Self-pay

## 2022-08-19 LAB — RESP PANEL BY RT-PCR (FLU A&B, COVID) ARPGX2
Influenza A by PCR: NEGATIVE
Influenza B by PCR: NEGATIVE
SARS Coronavirus 2 by RT PCR: NEGATIVE

## 2022-08-19 LAB — MAGNESIUM: Magnesium: 1.6 mg/dL — ABNORMAL LOW (ref 1.7–2.4)

## 2022-08-19 MED ORDER — METOCLOPRAMIDE HCL 5 MG/ML IJ SOLN
10.0000 mg | Freq: Once | INTRAMUSCULAR | Status: AC
  Administered 2022-08-19: 10 mg via INTRAVENOUS
  Filled 2022-08-19: qty 2

## 2022-08-19 MED ORDER — MAGNESIUM SULFATE 2 GM/50ML IV SOLN
2.0000 g | Freq: Once | INTRAVENOUS | Status: AC
  Administered 2022-08-19: 2 g via INTRAVENOUS
  Filled 2022-08-19: qty 50

## 2022-08-19 MED ORDER — POTASSIUM CHLORIDE CRYS ER 20 MEQ PO TBCR
40.0000 meq | EXTENDED_RELEASE_TABLET | Freq: Once | ORAL | Status: AC
  Administered 2022-08-19: 40 meq via ORAL
  Filled 2022-08-19: qty 2

## 2022-08-19 MED ORDER — POTASSIUM CHLORIDE 10 MEQ/100ML IV SOLN
10.0000 meq | INTRAVENOUS | Status: AC
  Administered 2022-08-19 (×2): 10 meq via INTRAVENOUS
  Filled 2022-08-19 (×2): qty 100

## 2022-08-19 MED ORDER — KETOROLAC TROMETHAMINE 15 MG/ML IJ SOLN
15.0000 mg | Freq: Once | INTRAMUSCULAR | Status: AC
  Administered 2022-08-19: 15 mg via INTRAVENOUS
  Filled 2022-08-19: qty 1

## 2022-08-19 MED ORDER — LEVETIRACETAM 500 MG PO TABS
1500.0000 mg | ORAL_TABLET | Freq: Two times a day (BID) | ORAL | 3 refills | Status: DC
  Filled 2022-08-19: qty 180, 30d supply, fill #0

## 2022-08-19 MED ORDER — MAGNESIUM OXIDE 400 MG PO TABS
400.0000 mg | ORAL_TABLET | Freq: Two times a day (BID) | ORAL | 0 refills | Status: AC
  Filled 2022-08-19: qty 14, 7d supply, fill #0

## 2022-08-19 MED ORDER — LACTATED RINGERS IV BOLUS
1000.0000 mL | Freq: Once | INTRAVENOUS | Status: AC
  Administered 2022-08-19: 1000 mL via INTRAVENOUS

## 2022-08-19 MED ORDER — LEVETIRACETAM 500 MG PO TABS
1500.0000 mg | ORAL_TABLET | Freq: Two times a day (BID) | ORAL | 1 refills | Status: DC
  Filled 2022-08-19: qty 180, 30d supply, fill #0

## 2022-08-19 MED ORDER — DEXAMETHASONE SODIUM PHOSPHATE 10 MG/ML IJ SOLN
10.0000 mg | Freq: Once | INTRAMUSCULAR | Status: AC
  Administered 2022-08-19: 10 mg via INTRAVENOUS
  Filled 2022-08-19: qty 1

## 2022-08-19 MED ORDER — POTASSIUM CHLORIDE CRYS ER 20 MEQ PO TBCR
20.0000 meq | EXTENDED_RELEASE_TABLET | Freq: Two times a day (BID) | ORAL | 0 refills | Status: DC
  Filled 2022-08-19: qty 14, 7d supply, fill #0

## 2022-08-19 NOTE — Discharge Instructions (Addendum)
You were seen here in the ER for evaluation of your seizure. I have spoken with the neurologist who has taken care of your previously. It is extremely important for you to follow up with them. Please make sure you call to schedule an appointment with neurology.  She would like your Keppra dose increased to 1500mg  twice daily.  This will be 3 capsules in the morning and 3 capsules at night of the new medication I am prescribing you.  Please stop using your other medication.  According [facility] law, please make sure you are not operating a vehicle over the next 6 months.  Please follow-up with your primary care doctor to have your potassium and magnesium rechecked.  If you have any concerns, new or worsening symptoms, please return to the emergency department for evaluation.  Your potassium and magnesium were also both low.  Please take the supplements for the next week.  They are twice daily medications.  Contact a doctor if: You have another seizure or seizures. Call the doctor each time you have a seizure. The pattern of your seizures changes. You keep having seizures with treatment. You have symptoms of being sick or having an infection. You are not able to take your medicine. Get help right away if: You have any of these problems: A seizure that lasts longer than 5 minutes. Many seizures in a row and you do not feel better between seizures. A seizure that makes it harder to breathe. A seizure and you can no longer speak or use part of your body. You do not wake up right after a seizure. You get hurt during a seizure. You feel confused or have pain right after a seizure. These symptoms may be an emergency. Get help right away. Call your local emergency services (911 in the [place] Do not wait to see if the symptoms will go away. Do not drive yourself to the hospital.

## 2022-08-19 NOTE — ED Provider Notes (Signed)
I received this patient in handoff from [facility], PA-C.  Please see her note for original history and work-up thus far.  In short patient is a 31 year old male who presented today with a seizure.  There was questionable facial trauma so CT maxillofacial was ordered.  Plan is for me to follow-up on the CT scan and wait for TOC to bring his Keppra to the bedside and discharge home   Physical Exam  BP (!) 131/93   Pulse (!) 59   Temp 98.3 F (36.8 C) (Oral)   Resp 14   SpO2 98%   Physical Exam Vitals and nursing note reviewed.  Constitutional:      Appearance: Normal appearance.  HENT:     Head: Normocephalic and atraumatic.  Eyes:     General: No scleral icterus.    Conjunctiva/sclera: Conjunctivae normal.  Pulmonary:     Effort: Pulmonary effort is normal. No respiratory distress.  Skin:    Findings: No rash.  Neurological:     Mental Status: He is alert.  Psychiatric:        Mood and Affect: Mood normal.     Procedures  Procedures  ED Course / MDM   Clinical Course as of 08/19/22 1719  Tue Aug 19, 2022  1318 Seizure, back to baseline seen yesterday, reassuring workup  Discussing with neuro and IVF.  Lyte replacement. Likely DC  [CC]    Clinical Course User Index [CC] Tretha Sciara, MD   Medical Decision Making Amount and/or Complexity of Data Reviewed Labs: ordered. Radiology: ordered.  Risk OTC drugs. Prescription drug management.   CT maxillofacial reviewed and interpreted by me.  I agree with radiology that there are no fractures or trauma sustained from patient's seizure.   Patient received Keppra.  [place] pharmacy closed prior to electrolyte prescriptions.  Patient given instructions for over-the-counter purchase and dietary sources    Rilyn Scroggs, Cecilio Asper, PA-C 08/19/22 2136    Elgie Congo, MD 08/19/22 2228

## 2022-08-19 NOTE — ED Notes (Signed)
Pt ambulatory to bathroom

## 2022-08-19 NOTE — ED Provider Notes (Signed)
[name] EMERGENCY DEPARTMENT Provider Note   CSN: [id number] Arrival date & time: 08/18/22  2001     History Chief Complaint  Patient presents with   Seizures    John Krueger is a 31 y.o. male with h/o seizure disorder on Keppra presents the emergency department for evaluation of a seizure yesterday while at home.  Patient reports that he last saw neurologist 5 to 6 months ago.  He reports that he was standing in his bathroom when he had a seizure.  He is having mild headache that is been improving since his long wait in the waiting room.  Denies any blurry vision, chest pain, or shortness of breath.  He reports that he did take his morning dose this morning while in the waiting room of Keppra.  Denies any recent cough or cold symptoms.  He does mention that he has some right knee pain and thinks he may have hit it during a seizure. Patient reports compliance to his current medication and reports that he never misses a dose.  He denies any other medical conditions.  Denies any recent surgeries.  Denies any new medication changes.  No known allergies.  Denies any EtOH, tobacco, or illicit drug use.   Seizures      Home Medications Prior to Admission medications   Medication Sig Start Date End Date Taking? Authorizing Provider  levETIRAcetam (KEPPRA) 1000 MG tablet Take 1 tablet (1,000 mg total) by mouth 2 (two) times daily. 03/10/22   Cristopher Peru, PA-C  levETIRAcetam (KEPPRA) 250 MG tablet Take 1 tablet (250 mg total) by mouth 2 (two) times daily. 03/10/22 03/05/23  Cristopher Peru, PA-C  methocarbamol (ROBAXIN) 500 MG tablet Take 2 tablets (1,000 mg total) by mouth 2 (two) times daily. Patient taking differently: Take 1,000 mg by mouth 2 (two) times daily as needed for muscle spasms. 07/13/21   Linwood Dibbles, MD  naproxen (NAPROSYN) 500 MG tablet Take 1 tablet (500 mg total) by mouth 2 (two) times daily with a meal. Patient not taking: Reported on 02/23/2022 07/13/21    Linwood Dibbles, MD  tiZANidine (ZANAFLEX) 2 MG tablet Take 1 tablet (2 mg total) by mouth every 8 (eight) hours as needed for muscle spasms. Patient not taking: No sig reported 07/05/21   Caccavale, Sophia, PA-C      Allergies    Patient has no known allergies.    Review of Systems   Review of Systems  Constitutional:  Negative for chills and fever.  HENT:  Negative for congestion and rhinorrhea.   Eyes:  Negative for visual disturbance.  Respiratory:  Negative for shortness of breath.   Cardiovascular:  Negative for chest pain.  Gastrointestinal:  Negative for abdominal pain, diarrhea and vomiting.  Neurological:  Positive for seizures and headaches.    Physical Exam Updated Vital Signs BP 130/87   Pulse 69   Temp 98.8 F (37.1 C)   Resp 18   SpO2 93%  Physical Exam Vitals and nursing note reviewed.  Constitutional:      General: He is not in acute distress.    Appearance: Normal appearance. He is not ill-appearing or toxic-appearing.  HENT:     Head: Normocephalic and atraumatic.     Comments: No tenderness palpation.  No step-offs or deformities.  No battle signs or raccoon eyes.    Mouth/Throat:     Comments: Poor dentition throughout.  Moist mucous membranes.  No obvious abscess or induration or fluctuance seen. Eyes:  General: No scleral icterus.    Extraocular Movements: Extraocular movements intact.     Pupils: Pupils are equal, round, and reactive to light.  Neck:     Comments: Patient does have some diffuse posterior neck tenderness.  No obvious signs of trauma.  No step-offs or deformities.  Full range of motion without pain. Cardiovascular:     Rate and Rhythm: Normal rate and regular rhythm.     Pulses: Normal pulses.  Pulmonary:     Effort: Pulmonary effort is normal.     Breath sounds: Normal breath sounds.     Comments: Mild right-sided chest tenderness palpation.  No step-offs or deformities.  No signs of trauma.  No increased erythema or warmth.  No  crepitus noted. Chest:     Chest wall: Tenderness present.  Abdominal:     General: Abdomen is flat. Bowel sounds are normal.     Palpations: Abdomen is soft.     Tenderness: There is no abdominal tenderness. There is no guarding or rebound.  Musculoskeletal:        General: Tenderness present. No deformity.     Cervical back: Normal range of motion. Tenderness present.     Comments: Diffuse tenderness to left knee.  No step-offs or deformities.  Compartments are soft.  No overlying skin changes noted.  Palpable pulses.    Skin:    General: Skin is warm and dry.  Neurological:     General: No focal deficit present.     Mental Status: He is alert. Mental status is at baseline.     GCS: GCS eye subscore is 4. GCS verbal subscore is 5. GCS motor subscore is 6.     Cranial Nerves: No cranial nerve deficit, dysarthria or facial asymmetry.     Sensory: No sensory deficit.     Motor: No weakness or pronator drift.     Comments: Patient is alert and oriented.  GCS 15.  Cranial nerves II through XII intact.  He is answering questions appropriately with appropriate speech.  No facial droop noted.  Sensation intact throughout.  Strength is 5 of 5 in patient's upper and lower bilateral extremities.  No pronator drift.     ED Results / Procedures / Treatments   Labs (all labs ordered are listed, but only abnormal results are displayed) Labs Reviewed  CBC - Abnormal; Notable for the following components:      Result Value   Platelets 136 (*)    All other components within normal limits  COMPREHENSIVE METABOLIC PANEL - Abnormal; Notable for the following components:   Potassium 2.8 (*)    CO2 19 (*)    Glucose, Bld 109 (*)    BUN <5 (*)    AST 74 (*)    Anion gap 17 (*)    All other components within normal limits  MAGNESIUM - Abnormal; Notable for the following components:   Magnesium 1.6 (*)    All other components within normal limits  RESP PANEL BY RT-PCR (FLU A&B, COVID) [id number]   LEVETIRACETAM LEVEL    EKG None  Radiology CT Cervical Spine Wo Contrast  Result Date: 08/19/2022 CLINICAL DATA:  Neck trauma, seizures EXAM: CT CERVICAL SPINE WITHOUT CONTRAST TECHNIQUE: Multidetector CT imaging of the cervical spine was performed without intravenous contrast. Multiplanar CT image reconstructions were also generated. RADIATION DOSE REDUCTION: This exam was performed according to the departmental dose-optimization program which includes automated exposure control, adjustment of the mA and/or kV according to patient size  and/or use of iterative reconstruction technique. COMPARISON:  06/12/2021 FINDINGS: Alignment: Straightening and reversal of the normal cervical lordosis, likely positional. Skull base and vertebrae: No acute fracture. No primary bone lesion or focal pathologic process. Soft tissues and spinal canal: No prevertebral fluid or swelling. No visible canal hematoma. Disc levels:  Intact. Upper chest: Negative. Other: None. IMPRESSION: No fracture or static subluxation of the cervical spine. Disc spaces and vertebral body heights are preserved. Electronically Signed   By: Jearld Lesch M.D.   On: 08/19/2022 14:54   DG Knee Complete 4 Views Right  Result Date: 08/19/2022 CLINICAL DATA:  Right-sided chest pain. Right knee pain. Seizure with fall a couple of days ago. EXAM: RIGHT KNEE - COMPLETE 4+ VIEW COMPARISON:  None Available. FINDINGS: No evidence of fracture, dislocation, or joint effusion. No evidence of arthropathy or other focal bone abnormality. Soft tissues are unremarkable. IMPRESSION: Negative. Electronically Signed   By: Amie Portland M.D.   On: 08/19/2022 13:29   DG Chest 2 View  Result Date: 08/19/2022 CLINICAL DATA:  Right-sided reproducible chest pain. Recent seizure with fall. EXAM: CHEST - 2 VIEW COMPARISON:  Radiographs 02/23/2022. FINDINGS: The lateral view was repeated. There is lordotic positioning on the frontal examination. The heart size and  mediastinal contours are normal. The lungs are clear. There is no pleural effusion or pneumothorax. No acute osseous findings are identified. IMPRESSION: No active cardiopulmonary process. Electronically Signed   By: Carey Bullocks M.D.   On: 08/19/2022 13:29   CT HEAD WO CONTRAST ( )  Result Date: 08/18/2022 CLINICAL DATA:  Head trauma. Patient reports seizure episodes at home this afternoon. EXAM: CT HEAD WITHOUT CONTRAST TECHNIQUE: Contiguous axial images were obtained from the base of the skull through the vertex without intravenous contrast. RADIATION DOSE REDUCTION: This exam was performed according to the departmental dose-optimization program which includes automated exposure control, adjustment of the mA and/or kV according to patient size and/or use of iterative reconstruction technique. COMPARISON:  CT head dated December 24, 2021 FINDINGS: Brain: No evidence of acute infarction, hemorrhage, hydrocephalus, extra-axial collection or mass lesion/mass effect. Prominence of the ventricles and sulci secondary to generalized cerebral atrophy, advanced for age. Vascular: No hyperdense vessel or unexpected calcification. Skull: Normal. Negative for fracture or focal lesion. Sinuses/Orbits: No acute finding. Other: None. IMPRESSION: 1. No acute intracranial abnormality. 2. Generalized cerebral atrophy,  unchanged. Electronically Signed   By: Larose Hires D.O.   On: 08/18/2022 23:09    Procedures Procedures   Medications Ordered in ED Medications  magnesium sulfate IVPB 2 g 50 mL (2 g Intravenous New Bag/Given 08/19/22 1347)  potassium chloride 10 mEq in 100 mL IVPB (10 mEq Intravenous New Bag/Given 08/19/22 1345)  potassium chloride SA (KLOR-CON M) CR tablet 40 mEq (40 mEq Oral Given 08/19/22 1159)  lactated ringers bolus 1,000 mL (1,000 mLs Intravenous New Bag/Given 08/19/22 1254)  ketorolac (TORADOL) 15 MG/ML injection 15 mg (15 mg Intravenous Given 08/19/22 1338)  metoCLOPramide (REGLAN)  injection 10 mg (10 mg Intravenous Given 08/19/22 1336)  dexamethasone (DECADRON) injection 10 mg (10 mg Intravenous Given 08/19/22 1340)    ED Course/ Medical Decision Making/ A&P Clinical Course as of 08/19/22 1418  Tue Aug 19, 2022  1318 Seizure, back to baseline seen yesterday, reassuring workup  Discussing with neuro and IVF.  Lyte replacement. Likely DC  [CC]    Clinical Course User Index [CC] Glyn Ade, MD  Medical Decision Making Amount and/or Complexity of Data Reviewed Labs: ordered. Radiology: ordered.  Risk Prescription drug management.  31 year old male presents emerged department for evaluation of seizures.  Differential diagnosis includes limited to seizure disorder, electrolyte abnormality, dehydration.  Vital signs are unremarkable.  Patient normotensive, afebrile normal pulse rate, satting well room air without increased work of breathing.  Physical exam as noted above.  CT imaging ordered in triage also basic labs.  I did add on a few labs as well as additional imaging given the patient's complaint.  I independently reviewed and interpreted the patient's labs.  CBC shows thrombocytopenia at 136 although this appears to be chronic for the patient.  No ketosis or anemia seen.  Patient negative for COVID and flu.  CMP shows hypokalemia at 2.8 with a decrease in bicarb at 19 and anion gap of 17.  Glucose at 109.  AST is 74.  Appears that patient always has AST greater than ALT.  Magnesium 1.6.  Keppra level still pending.  Given the patient's hypokalemia and hypomagnesia, will replenish this.  He was given p.o. potassium, as well as 20 mEq via IV over 2 hours.  He was also given 2 g of magnesium.  On reevaluation, patient reports that his headache has been gradually improving, will order a migraine cocktail to help aid this.  He reports that he feels fine, does not have any seizure prodrome.  He would like to eat.  CT imaging of his  head and neck show no acute intracranial malady.  There is generalized cerebral atrophy but is unchanged as previous.  CT cervical spine shows no fracture or static subluxation of the cervical spine.  The disc space and vertebral body heights are preserved.  Plain film of his knee is negative.  Plain film of chest shows no acute cardiopulmonary process.   On reevaluation, as patient was going be discharged, he reports that he was having some pain with eating but forgot to mention this to me.  He reports that this has been since his fall.  He does not have any trismus and has full range of motion of his jaw however does have some tenderness to his upper and lower jaw.  Will order maxillofacial brought any occult fracture although given his poor dentition, and I have a high suspicion that it is from his poor dentition.  We will give him some dental referrals.  [facility] to bed given his financial hardships.  I discussed the patient the need to follow-up with neurology for control of his seizures.  We discussed his new medication regimen.  Will wait on his CT maxillofacial. Will hand off to oncoming shift.   5:26 PM Care of [facility] Roger[facility]onel Yount  [facility] at the end of my shift as the patient will require reassessment once labs/imaging have resulted. Patient presentation, ED course, and plan of care discussed with review of all pertinent labs and imaging. Please see his/her note for further details regarding further ED course and disposition. Plan at time of handoff is follow up on CT maxillofacial. This may be altered or completely changed at the discretion of the oncoming team pending results of further workup.   I discussed this case with my attending physician who cosigned this note including patient's presenting symptoms, physical exam, and planned diagnostics and interventions. Attending physician stated agreement with plan or made changes to plan  which were implemented.  Final Clinical Impression(s) / ED Diagnoses Final diagnoses:  Seizure ([place])  Hypokalemia  Hypomagnesemia    Rx / DC Orders ED Discharge Orders          Ordered    levETIRAcetam (KEPPRA) 500 MG tablet  2 times daily,   Status:  Discontinued        08/19/22 1534    levETIRAcetam (KEPPRA) 500 MG tablet  2 times daily        08/19/22 1535              Sherrell Puller, [place] 08/19/22 1727    Tretha Sciara, MD 08/20/22 [phone]

## 2022-08-19 NOTE — ED Notes (Signed)
Patient verbalizes understanding of d/c instructions. Pt given keppra. Opportunities for questions and answers were provided. Pt d/c from ED and ambulated to lobby.

## 2022-08-20 ENCOUNTER — Other Ambulatory Visit (HOSPITAL_COMMUNITY): Payer: Self-pay

## 2022-08-20 LAB — LEVETIRACETAM LEVEL: Levetiracetam Lvl: <2 ug/mL — ABNORMAL LOW (ref 10.0–40.0)

## 2022-09-04 ENCOUNTER — Emergency Department (HOSPITAL_COMMUNITY)
Admission: EM | Admit: 2022-09-04 | Discharge: 2022-09-04 | Disposition: A | Discharge status: Home / Self Care | Payer: Medicaid Other | Attending: Emergency Medicine | Admitting: Emergency Medicine

## 2022-09-04 ENCOUNTER — Encounter (HOSPITAL_COMMUNITY): Payer: Self-pay | Admitting: Emergency Medicine

## 2022-09-04 ENCOUNTER — Other Ambulatory Visit: Payer: Self-pay

## 2022-09-04 DIAGNOSIS — H9202 Otalgia, left ear: Secondary | ICD-10-CM | POA: Insufficient documentation

## 2022-09-04 DIAGNOSIS — K0889 Other specified disorders of teeth and supporting structures: Secondary | ICD-10-CM | POA: Insufficient documentation

## 2022-09-04 MED ORDER — NAPROXEN 500 MG PO TABS: 500.0000 mg | ORAL_TABLET | Freq: Two times a day (BID) | ORAL | 0 refills | Status: DC

## 2022-09-04 MED ORDER — PENICILLIN V POTASSIUM 500 MG PO TABS: 500.0000 mg | ORAL_TABLET | Freq: Four times a day (QID) | ORAL | 0 refills | Status: AC

## 2022-09-04 NOTE — ED Triage Notes (Signed)
Patient reports left ear ache for 3 days , denies injury /no hearing loss or drainage .

## 2022-09-04 NOTE — Discharge Instructions (Signed)
Take the prescribed medication as directed. Follow-up with dentist-- call in the morning to get appt scheduled. Return to the ED for new or worsening symptoms.

## 2022-09-04 NOTE — ED Provider Notes (Signed)
[facility] EMERGENCY DEPARTMENT Provider Note   CSN: [id number] Arrival date & time: 09/04/22  0547     History  Chief Complaint  Patient presents with   Otalgia    John Krueger is a 31 y.o. male.  The history is provided by the patient and medical records.  Otalgia  31 y.o. M here with left sided ear and dental pain.  States cold air is hurting his teeth but feels pain in left side of his head and ear.  Reports subjective fever.  Does not currently have a dentist.  Home Medications Prior to Admission medications   Medication Sig Start Date End Date Taking? Authorizing Provider  naproxen (NAPROSYN) 500 MG tablet Take 1 tablet (500 mg total) by mouth 2 (two) times daily. 09/04/22  Yes Garlon Hatchet, PA-C  penicillin v potassium (VEETID) 500 MG tablet Take 1 tablet (500 mg total) by mouth 4 (four) times daily for 10 days. 09/04/22 09/14/22 Yes Garlon Hatchet, PA-C  levETIRAcetam (KEPPRA) 500 MG tablet Take 3 tablets (1,500 mg total) by mouth 2 (two) times daily. 08/19/22   Achille Rich, PA-C  methocarbamol (ROBAXIN) 500 MG tablet Take 2 tablets (1,000 mg total) by mouth 2 (two) times daily. Patient not taking: Reported on 08/19/2022 07/13/21   Linwood Dibbles, MD  potassium chloride SA (KLOR-CON M) 20 MEQ tablet Take 1 tablet (20 mEq total) by mouth 2 (two) times daily. 08/19/22   Redwine, Madison A, PA-C  tiZANidine (ZANAFLEX) 2 MG tablet Take 1 tablet (2 mg total) by mouth every 8 (eight) hours as needed for muscle spasms. Patient not taking: Reported on 07/13/2021 07/05/21   Caccavale, Sophia, PA-C      Allergies    Patient has no known allergies.    Review of Systems   Review of Systems  HENT:  Positive for ear pain.   All other systems reviewed and are negative.   Physical Exam Updated Vital Signs BP (!) 146/101 (BP Location: Right Arm)   Pulse (!) 112   Temp 99.4 F (37.4 C) (Oral)   Resp 18   SpO2 98%   Physical Exam Vitals and nursing note  reviewed.  Constitutional:      Appearance: He is well-developed.  HENT:     Head: Normocephalic and atraumatic.     Ears:     Comments: TM's normal in appearance    Mouth/Throat:     Comments: Teeth largely in poor dentition, generalized decay, surrounding gingiva with discoloration but no swelling or abscess noted, handling secretions appropriately, no trismus, no facial or neck swelling, normal phonation without stridor Eyes:     Conjunctiva/sclera: Conjunctivae normal.     Pupils: Pupils are equal, round, and reactive to light.  Cardiovascular:     Rate and Rhythm: Normal rate and regular rhythm.     Heart sounds: Normal heart sounds.  Pulmonary:     Effort: Pulmonary effort is normal. No respiratory distress.     Breath sounds: Normal breath sounds. No rhonchi.  Musculoskeletal:        General: Normal range of motion.     Cervical back: Normal range of motion.  Skin:    General: Skin is warm and dry.  Neurological:     Mental Status: He is alert and oriented to person, place, and time.     ED Results / Procedures / Treatments   Labs (all labs ordered are listed, but only abnormal results are displayed) Labs Reviewed - No  data to display  EKG None  Radiology No results found.  Procedures Procedures    Medications Ordered in ED Medications - No data to display  ED Course/ Medical Decision Making/ A&P                           Medical Decision Making Risk Prescription drug management.   31 y.o. M here with left sided dental pain and facial pain.  No facial or neck swelling on exam.  Generalized decay without drainable fluid collection.  Left TM normal in appearance.  Suspect likely dental in origin.  No signs/symptoms concerning for ludwig's angina.  Will start on abx and refer to dentist for follow-up.  Can return here for new concerns.  Final Clinical Impression(s) / ED Diagnoses Final diagnoses:  Pain, dental    Rx / DC Orders ED Discharge Orders           Ordered    penicillin v potassium (VEETID) 500 MG tablet  4 times daily        09/04/22 0602    naproxen (NAPROSYN) 500 MG tablet  2 times daily        09/04/22 0602              Garlon Hatchet, PA-C 09/04/22 8882    Shon Baton, MD 09/05/22 [phone]

## 2022-11-06 ENCOUNTER — Emergency Department (HOSPITAL_COMMUNITY)
Admission: EM | Admit: 2022-11-06 | Discharge: 2022-11-07 | Disposition: A | Discharge status: Home / Self Care | Payer: Medicaid Other | Attending: Emergency Medicine | Admitting: Emergency Medicine

## 2022-11-06 ENCOUNTER — Encounter (HOSPITAL_COMMUNITY): Payer: Self-pay | Admitting: Emergency Medicine

## 2022-11-06 ENCOUNTER — Other Ambulatory Visit: Payer: Self-pay

## 2022-11-06 DIAGNOSIS — R202 Paresthesia of skin: Secondary | ICD-10-CM | POA: Insufficient documentation

## 2022-11-06 DIAGNOSIS — H538 Other visual disturbances: Secondary | ICD-10-CM | POA: Diagnosis not present

## 2022-11-06 DIAGNOSIS — H539 Unspecified visual disturbance: Secondary | ICD-10-CM

## 2022-11-06 LAB — COMPREHENSIVE METABOLIC PANEL
ALT: 54 U/L — ABNORMAL HIGH (ref 0–44)
AST: 102 U/L — ABNORMAL HIGH (ref 15–41)
Albumin: 4 g/dL (ref 3.5–5.0)
Alkaline Phosphatase: 83 U/L (ref 38–126)
Anion gap: 11 (ref 5–15)
BUN: <5 mg/dL — ABNORMAL LOW (ref 6–20)
CO2: 22 mmol/L (ref 22–32)
Calcium: 8.9 mg/dL (ref 8.9–10.3)
Chloride: 105 mmol/L (ref 98–111)
Creatinine, Ser: 0.98 mg/dL (ref 0.61–1.24)
GFR, Estimated: >60 mL/min (ref >60)
Glucose, Bld: 96 mg/dL (ref 70–99)
Potassium: 3.7 mmol/L (ref 3.5–5.1)
Sodium: 138 mmol/L (ref 135–145)
Total Bilirubin: 0.4 mg/dL (ref 0.3–1.2)
Total Protein: 7.4 g/dL (ref 6.5–8.1)

## 2022-11-06 LAB — CBC WITH DIFFERENTIAL/PLATELET
Abs Immature Granulocytes: 0.02 10*3/uL (ref 0.00–0.07)
Basophils Absolute: 0 10*3/uL (ref 0.0–0.1)
Basophils Relative: 1 %
Eosinophils Absolute: 0.1 10*3/uL (ref 0.0–0.5)
Eosinophils Relative: 1 %
HCT: 37.3 % — ABNORMAL LOW (ref 39.0–52.0)
Hemoglobin: 12.7 g/dL — ABNORMAL LOW (ref 13.0–17.0)
Immature Granulocytes: 0 %
Lymphocytes Relative: 49 %
Lymphs Abs: 3.2 10*3/uL (ref 0.7–4.0)
MCH: 32.6 pg (ref 26.0–34.0)
MCHC: 34 g/dL (ref 30.0–36.0)
MCV: 95.9 fL (ref 80.0–100.0)
Monocytes Absolute: 0.8 10*3/uL (ref 0.1–1.0)
Monocytes Relative: 12 %
Neutro Abs: 2.4 10*3/uL (ref 1.7–7.7)
Neutrophils Relative %: 37 %
Platelets: 157 10*3/uL (ref 150–400)
RBC: 3.89 MIL/uL — ABNORMAL LOW (ref 4.22–5.81)
RDW: 15.1 % (ref 11.5–15.5)
WBC: 6.6 10*3/uL (ref 4.0–10.5)
nRBC: 0 % (ref 0.0–0.2)

## 2022-11-06 LAB — URINALYSIS, ROUTINE W REFLEX MICROSCOPIC
Bacteria, UA: NONE SEEN
Bilirubin Urine: NEGATIVE
Glucose, UA: NEGATIVE mg/dL
Hgb urine dipstick: NEGATIVE
Ketones, ur: NEGATIVE mg/dL
Leukocytes,Ua: NEGATIVE
Nitrite: NEGATIVE
Protein, ur: NEGATIVE mg/dL
Specific Gravity, Urine: 1.002 — ABNORMAL LOW (ref 1.005–1.030)
pH: 6 (ref 5.0–8.0)

## 2022-11-06 NOTE — ED Triage Notes (Signed)
Pt is a difficult historian.  He reports 4 days ago he began having double vision.  He also reports that when he lays down in bed he can't feel his legs.  Pt is ambulatory in triage.  Pt also c/o facial swelling and poor memory.

## 2022-11-07 ENCOUNTER — Emergency Department (HOSPITAL_COMMUNITY): Payer: Medicaid Other

## 2022-11-07 NOTE — ED Notes (Signed)
Patient verbalizes understanding of d/c instructions. Opportunities for questions and answers were provided. Pt d/c from ED and ambulated to lobby.

## 2022-11-07 NOTE — Discharge Instructions (Signed)
Your CT head today was normal. We do recommend that you follow-up with your neurologist-- call for appt. You likely will benefit from new eye exam-- can follow-up with Dr. Manuella Ghazi.  Call his office to make an appt. Return here for new concerns.

## 2022-11-07 NOTE — ED Provider Notes (Signed)
[facility] EMERGENCY DEPARTMENT Provider Note   CSN: [id number] Arrival date & time: 11/06/22  2119     History  Chief Complaint  Patient presents with   Eye Problem   multiple complaints    John Krueger is a 32 y.o. Krueger.  The history is provided by the patient and medical records.  Eye Problem  John y.o. M with hx of seizures, presenting to the ED with multiple complaints.  Patient states for about 4 weeks now he has been having trouble with his vision.  He states this seems to come and go.  States when he is having trouble he sees double in both of his eyes.  He does report he used to wear glasses, however they are currently broken so has not been able to wear them.  He has not yet been able to see the eye doctor about getting new glasses yet.  He also reports at times he has tingling in his arms and legs.  States this varies which are more with leg, but also intermittent.  He has not noticed anything that makes this better or worse.  He has not had any focal weakness in his arms or legs.  He has not had any difficulty walking.  When questioned about recent seizures he states "I do not know".  He reports he is taking his Keppra as directed.  He has not had any recent follow-up with his neurologist.  Home Medications Prior to Admission medications   Medication Sig Start Date End Date Taking? Authorizing Provider  levETIRAcetam (KEPPRA) 500 MG tablet Take 3 tablets (1,500 mg total) by mouth 2 (two) times daily. Patient not taking: Reported on 11/06/2022 08/19/22   Sherrell Puller, PA-C  methocarbamol (ROBAXIN) 500 MG tablet Take 2 tablets (1,000 mg total) by mouth 2 (two) times daily. Patient not taking: Reported on 11/06/2022 07/13/21   Dorie Rank, MD  naproxen (NAPROSYN) 500 MG tablet Take 1 tablet (500 mg total) by mouth 2 (two) times daily. Patient not taking: Reported on 11/06/2022 09/04/22   Larene Pickett, PA-C  potassium chloride SA (KLOR-CON M) 20 MEQ tablet Take 1  tablet (20 mEq total) by mouth 2 (two) times daily. Patient not taking: Reported on 11/06/2022 08/19/22   Redwine, Madison A, PA-C  tiZANidine (ZANAFLEX) 2 MG tablet Take 1 tablet (2 mg total) by mouth every 8 (eight) hours as needed for muscle spasms. Patient not taking: Reported on 07/13/2021 07/05/21   Caccavale, Sophia, PA-C      Allergies    Patient has no known allergies.    Review of Systems   Review of Systems  Eyes:  Positive for visual disturbance.  All other systems reviewed and are negative.   Physical Exam Updated Vital Signs BP 107/76   Pulse (!) 115   Temp 98.3 F (36.8 C)   Resp 20   SpO2 98%  Physical Exam Vitals and nursing note reviewed.  Constitutional:      Appearance: He is well-developed.  HENT:     Head: Normocephalic and atraumatic.  Eyes:     Conjunctiva/sclera: Conjunctivae normal.     Pupils: Pupils are equal, round, and reactive to light.     Comments: PERRL, EOMs intact, no nystagmus noted  Cardiovascular:     Rate and Rhythm: Normal rate and regular rhythm.     Heart sounds: Normal heart sounds.  Pulmonary:     Effort: Pulmonary effort is normal.     Breath sounds: Normal  breath sounds.  Abdominal:     General: Bowel sounds are normal.     Palpations: Abdomen is soft.  Musculoskeletal:        General: Normal range of motion.     Cervical back: Normal range of motion.  Skin:    General: Skin is warm and dry.  Neurological:     Mental Status: He is alert and oriented to person, place, and time.     Comments: AAOx3, answering questions and following commands appropriately; equal strength UE and LE bilaterally; CN grossly intact; moves all extremities appropriately without ataxia; no focal neuro deficits or facial asymmetry appreciated, speech clear and goal oriented, normal gait observed     ED Results / Procedures / Treatments   Labs (all labs ordered are listed, but only abnormal results are displayed) Labs Reviewed  COMPREHENSIVE  METABOLIC PANEL - Abnormal; Notable for the following components:      Result Value   BUN <5 (*)    AST 102 (*)    ALT John (*)    All other components within normal limits  CBC WITH DIFFERENTIAL/PLATELET - Abnormal; Notable for the following components:   RBC 3.89 (*)    Hemoglobin 12.7 (*)    HCT 37.3 (*)    All other components within normal limits  URINALYSIS, ROUTINE W REFLEX MICROSCOPIC - Abnormal; Notable for the following components:   Color, Urine STRAW (*)    Specific Gravity, Urine 1.002 (*)    All other components within normal limits    EKG None  Radiology CT HEAD WO CONTRAST ( )  Result Date: 11/07/2022 CLINICAL DATA:  [place] EXAM: CT HEAD WITHOUT CONTRAST TECHNIQUE: Contiguous axial images were obtained from the base of the skull through the vertex without intravenous contrast. RADIATION DOSE REDUCTION: This exam was performed according to the departmental dose-optimization program which includes automated exposure control, adjustment of the mA and/or kV according to patient size and/or use of iterative reconstruction technique. COMPARISON:  08/18/2022, 03/06/2020 FINDINGS: Brain: Moderate diffuse parenchymal volume loss is again identified, advanced given the patient's age, stable since remote prior examination. No acute intracranial hemorrhage or infarct. No abnormal mass effect or midline shift. No abnormal intra or extra-axial mass lesion or fluid collection. Stable ventriculomegaly, commensurate with the degree of parenchymal volume loss and likely representing ex vacuo dilation. Cerebellum underlies unremarkable. Vascular: No hyperdense vessel or unexpected calcification. Skull: Normal. Negative for fracture or focal lesion. Sinuses/Orbits: Disconjugate gaze noted. The orbits are otherwise unremarkable. Paranasal sinuses are clear. Other: Mastoid air cells and middle ear cavities are clear. IMPRESSION: 1. No acute intracranial abnormality. 2. Moderate diffuse parenchymal  volume loss, advanced given the patient's age, stable since remote prior examination. 3. Disconjugate gaze. Electronically Signed   By: Helyn Numbers M.D.   On: 11/07/2022 00:John    Procedures Procedures    Medications Ordered in ED Medications - No data to display  ED Course/ Medical Decision Making/ A&P                           Medical Decision Making Amount and/or Complexity of Data Reviewed Labs: ordered. Radiology: ordered and independent interpretation performed. ECG/medicine tests: ordered and independent interpretation performed.   32 year old Krueger presenting to the ED with multiple complaints.  He reports 4 weeks of intermittent double vision.  States broke his glasses and issues since that time.  Also having intermittent numbness and paresthesias of arms and legs, variable distribution  without alleviating or exacerbating factors.  Patient is awake, alert, oriented.  He is somewhat of a difficult historian.  He is answering questions and following commands appropriately.  He does not have any focal deficits on neurologic exam today.  Labs are overall reassuring.  CT head also negative aside from chronic findings.  His symptoms seem quite atypical, reported numbness and paresthesias not following any particular distribution, varying with each occurrence.  He does follow with outpatient [place] neurology, feel he can discuss further with them.  Given eye issues, may be that he has not been wearing his glasses.  Would likely benefit from eye exam, will refer to on-call ophthalmology.  He can return here for any new or acute changes.  Final Clinical Impression(s) / ED Diagnoses Final diagnoses:  Visual disturbance    Rx / DC Orders ED Discharge Orders     None         Larene Pickett, PA-C 11/07/22 0301    Fatima Blank, MD 11/07/22 [phone]

## 2023-01-15 ENCOUNTER — Ambulatory Visit (INDEPENDENT_AMBULATORY_CARE_PROVIDER_SITE_OTHER): Payer: Medicaid Other | Admitting: Family Medicine

## 2023-01-15 VITALS — BP 131/89 | HR 99 | Ht 66.0 in | Wt 171.6 lb

## 2023-01-15 DIAGNOSIS — K029 Dental caries, unspecified: Secondary | ICD-10-CM | POA: Diagnosis not present

## 2023-01-15 DIAGNOSIS — G8929 Other chronic pain: Secondary | ICD-10-CM | POA: Insufficient documentation

## 2023-01-15 DIAGNOSIS — G894 Chronic pain syndrome: Secondary | ICD-10-CM

## 2023-01-15 DIAGNOSIS — G40909 Epilepsy, unspecified, not intractable, without status epilepticus: Secondary | ICD-10-CM | POA: Insufficient documentation

## 2023-01-15 DIAGNOSIS — R569 Unspecified convulsions: Secondary | ICD-10-CM

## 2023-01-15 NOTE — Assessment & Plan Note (Signed)
Uncontrolled. Taking naprosyn and robaxin per report. Unclear etiology but could consider neuropathic vs psychosomatic etiology. Will further address at next visit.

## 2023-01-15 NOTE — Patient Instructions (Addendum)
It was great to see you today! Here's what we talked about:  I have given you a letter stating you can not work with your health conditions at this time. I would like to see you back in 1-2 weeks when I am available again to go over your medical history and discuss your pain. I have referred you to another neurologist that will hopefully take medicaid. They will help control your seizures. I am referring you to care management to help find a dentist and other resources that can help you.  Please let me know if you have any other questions.  Dr. Marcha Dutton

## 2023-01-15 NOTE — Progress Notes (Signed)
    SUBJECTIVE:   CHIEF COMPLAINT / HPI: new patient  Seizures Has been going on for 3 years. Has episodes as much as 2-3 times per week. These episodes are accompanied by HA, tonicity, watering eyes. He has hit his head on the wall multiple times. They last an unknown amount of time because he does not remember the episodes and feels he loses his memory for a bit. Has been to ED back to back for this. Has been connected with SW for this to help with medicaid and disability, though this has been denied. He has seen neurology for this once and placed Keppra 1000 mg BID but has not been back due to insurance. It is unclear if he is really taking this medicine, though on further questioning, he states he takes this, robaxin, and naprosyn daily.  OBJECTIVE:   BP 131/89   Pulse 99   Ht 5\' 6"  (1.676 m)   Wt 171 lb 9.6 oz (77.8 kg)   SpO2 99%   BMI 27.70 kg/m   General: Alert and oriented, in NAD Skin: Warm, dry HEENT: NCAT, EOM grossly normal, midline nasal septum, bottom lip with areas of hypopigmentation, tongue with thin white film overlying that is not able to be scraped off, globally poor dentition Cardiac: RRR, no m/r/g appreciated Respiratory: CTAB, breathing and speaking comfortably on RA Abdominal: Soft, nontender, nondistended, normoactive bowel sounds Extremities: Moves all extremities grossly equally Neurological: CN II-XII intact, strength and sensation intact throughout, no difficulties with walking, appears to have diminished visual acuity Psychiatric: Flat affect  ASSESSMENT/PLAN:   Seizures (HCC) Persistent and uncontrolled. Unclear what dosage, if any, he is taking of keppra. Unfortunately, he has not been able to follow with neurology for financial reasons. He had not obtained an EEG or MRI with neuro given the finances, though CT head in the past was normal. Reassured by overall normal neurological exam today. Will refer again today with emphasis on finding someone who  takes medicaid and follow up in 2 weeks. Provided note that excuses from work until further evaluation and treatment can be conducted.  Chronic pain Uncontrolled. Taking naprosyn and robaxin per report. Unclear etiology but could consider neuropathic vs psychosomatic etiology. Will further address at next visit.  Dental decay Marked plaque buildup and overall poor dentition noted. Has not seen a dentist but has history of swelling and pain in his jaw. Will refer to care management to aid in connection with dental resources that take his insurance as well as helping with medication management and neurology referral as above.   Health maintenance Will discuss HM items at subsequent visits.  Ethelene Hal, MD [place]

## 2023-01-15 NOTE — Assessment & Plan Note (Addendum)
Marked plaque buildup and overall poor dentition noted. Has not seen a dentist but has history of swelling and pain in his jaw. Will refer to care management to aid in connection with dental resources that take his insurance as well as helping with medication management and neurology referral as above.

## 2023-01-15 NOTE — Assessment & Plan Note (Addendum)
Persistent and uncontrolled. Unclear what dosage, if any, he is taking of keppra. Unfortunately, he has not been able to follow with neurology for financial reasons. He had not obtained an EEG or MRI with neuro given the finances, though CT head in the past was normal. Reassured by overall normal neurological exam today. Will refer again today with emphasis on finding someone who takes medicaid and follow up in 2 weeks. Provided note that excuses from work until further evaluation and treatment can be conducted.

## 2023-01-30 ENCOUNTER — Ambulatory Visit (INDEPENDENT_AMBULATORY_CARE_PROVIDER_SITE_OTHER): Payer: Medicaid Other | Admitting: Family Medicine

## 2023-01-30 ENCOUNTER — Encounter: Payer: Self-pay | Admitting: Family Medicine

## 2023-01-30 VITALS — BP 116/64 | HR 94 | Ht 66.0 in | Wt 172.0 lb

## 2023-01-30 DIAGNOSIS — K029 Dental caries, unspecified: Secondary | ICD-10-CM

## 2023-01-30 DIAGNOSIS — Z1159 Encounter for screening for other viral diseases: Secondary | ICD-10-CM | POA: Diagnosis not present

## 2023-01-30 DIAGNOSIS — R569 Unspecified convulsions: Secondary | ICD-10-CM

## 2023-01-30 MED ORDER — LEVETIRACETAM 500 MG PO TABS: 500.0000 mg | ORAL_TABLET | Freq: Two times a day (BID) | ORAL | 0 refills | Status: DC
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
## 2023-01-30 NOTE — Assessment & Plan Note (Signed)
Said to call a dentist who takes Medicaid on Monday.  Will follow-up progression at next visit.

## 2023-01-30 NOTE — Patient Instructions (Signed)
It was great to see you today! Here's what we talked about:  I am sending in keppra for you that can help your seizures. Please take this twice daily. I want you to come back in 2 weeks to see how you are feeling. I will send blood and urine tests today.  Please let me know if you have any other questions.  Dr. Phineas Real

## 2023-01-30 NOTE — Assessment & Plan Note (Addendum)
Uncontrolled.  Unable to see neurology until October. Etiology of seizures remain unknown per neurology.  Physical exam findings of macular rash on palms and soles could be concerning for neurosyphilis, especially in the context of a history of presumed STI that was untreated/tested for per records.  The rash on palms and soles, in addition to the lip lesions, could also indicate potential vasculitis, which may present with CNS dysfunction and seizures.  His symptoms could also be consistent with Peutz-Jeghers syndrome, though metastatic malignancy from this disorder is less likely.  His symptoms of headaches, presumably neuropathic burning pain, and convulsions could also be due to primary epileptic disorder.  For now, will send in Keppra 500 mg twice daily with follow-up in 2 weeks to assess.  Will also collect HIV, hepatitis C, RPR, CMP, and CBC today.  Patient had recently urinated, so cannot complete urine cytology today.

## 2023-02-03 LAB — COMPREHENSIVE METABOLIC PANEL
ALT: 40 IU/L (ref 0–44)
AST: 74 IU/L — ABNORMAL HIGH (ref 0–40)
Albumin/Globulin Ratio: 1.7 (ref 1.2–2.2)
Albumin: 4.8 g/dL (ref 4.3–5.2)
Alkaline Phosphatase: 97 IU/L (ref 44–121)
BUN/Creatinine Ratio: 5 — ABNORMAL LOW (ref 9–20)
BUN: 4 mg/dL — ABNORMAL LOW (ref 6–20)
Bilirubin Total: 0.3 mg/dL (ref 0.0–1.2)
CO2: 23 mmol/L (ref 20–29)
Calcium: 10.1 mg/dL (ref 8.7–10.2)
Chloride: 98 mmol/L (ref 96–106)
Creatinine, Ser: 0.87 mg/dL (ref 0.76–1.27)
Globulin, Total: 2.9 g/dL (ref 1.5–4.5)
Glucose: 69 mg/dL — ABNORMAL LOW (ref 70–99)
Potassium: 4.1 mmol/L (ref 3.5–5.2)
Sodium: 140 mmol/L (ref 134–144)
Total Protein: 7.7 g/dL (ref 6.0–8.5)
eGFR: 120 mL/min/{1.73_m2} (ref >59)

## 2023-02-03 LAB — CBC WITH DIFFERENTIAL/PLATELET
Basophils Absolute: 0 10*3/uL (ref 0.0–0.2)
Basos: 1 %
EOS (ABSOLUTE): 0 10*3/uL (ref 0.0–0.4)
Eos: 1 %
Hematocrit: 42.2 % (ref 37.5–51.0)
Hemoglobin: 14.4 g/dL (ref 13.0–17.7)
Immature Grans (Abs): 0 10*3/uL (ref 0.0–0.1)
Immature Granulocytes: 0 %
Lymphocytes Absolute: 2.5 10*3/uL (ref 0.7–3.1)
Lymphs: 48 %
MCH: 31.3 pg (ref 26.6–33.0)
MCHC: 34.1 g/dL (ref 31.5–35.7)
MCV: 92 fL (ref 79–97)
Monocytes Absolute: 0.5 10*3/uL (ref 0.1–0.9)
Monocytes: 11 %
Neutrophils Absolute: 1.9 10*3/uL (ref 1.4–7.0)
Neutrophils: 39 %
Platelets: 171 10*3/uL (ref 150–450)
RBC: 4.6 x10E6/uL (ref 4.14–5.80)
RDW: 15.2 % (ref 11.6–15.4)
WBC: 5 10*3/uL (ref 3.4–10.8)

## 2023-02-03 LAB — T PALLIDUM ANTIBODY, EIA: T pallidum Antibody, EIA: NEGATIVE

## 2023-02-03 LAB — HCV INTERPRETATION

## 2023-02-03 LAB — HCV AB W REFLEX TO QUANT PCR: HCV Ab: NONREACTIVE

## 2023-02-03 LAB — RPR W/REFLEX TO TREPSURE: RPR: NONREACTIVE

## 2023-02-03 LAB — HIV ANTIBODY (ROUTINE TESTING W REFLEX): HIV Screen 4th Generation wRfx: NONREACTIVE

## 2023-02-12 ENCOUNTER — Ambulatory Visit: Payer: Medicaid Other | Admitting: Family Medicine

## 2023-02-12 ENCOUNTER — Encounter: Payer: Self-pay | Admitting: Family Medicine

## 2023-02-12 VITALS — BP 118/74 | HR 79 | Ht 66.0 in | Wt 169.2 lb

## 2023-02-12 DIAGNOSIS — R569 Unspecified convulsions: Secondary | ICD-10-CM

## 2023-02-12 NOTE — Patient Instructions (Signed)
It was great to see you today! Here's what we talked about:  We will call you and your sister tomorrow. Continue taking your keppra. Do not drive and let your sister drive you.  Please let me know if you have any other questions.  Dr. Phineas Real

## 2023-02-12 NOTE — Assessment & Plan Note (Addendum)
Remains uncontrolled after Keppra dosing.  Lab results from last visit unrevealing.  Less likely neurosyphilis.  Peutz-Jeghers syndrome remains on differential giving abdominal pain, GI bleeding (which could also be hemorrhoidal in nature), and characteristic lip findings (see prior visit note).  However, I am reassured by his stable hemoglobin at 14 on last check 4/5.  Also consider tuberous sclerosis, NF 1, ataxia-telangiectasia in the context of his integumentary manifestations. He has a neurology appointment scheduled for October of this year.  Given patient's seizures are persistent and affecting daily activity, and he is not able to see a specialist for multiple months, will direct admit to our hospital service on 4/19 for neurology consultation, MRI, and likely overnight EEG.
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
## 2023-02-13 ENCOUNTER — Encounter (HOSPITAL_COMMUNITY): Payer: Self-pay

## 2023-02-13 ENCOUNTER — Observation Stay (HOSPITAL_COMMUNITY)
Admission: AD | Admit: 2023-02-13 | Discharge: 2023-02-16 | Disposition: A | Discharge status: Home / Self Care | Payer: Medicaid Other | Source: Ambulatory Visit | Attending: Family Medicine | Admitting: Family Medicine

## 2023-02-13 ENCOUNTER — Encounter (HOSPITAL_COMMUNITY): Payer: Self-pay | Admitting: Family Medicine

## 2023-02-13 ENCOUNTER — Other Ambulatory Visit: Payer: Self-pay

## 2023-02-13 DIAGNOSIS — G40909 Epilepsy, unspecified, not intractable, without status epilepticus: Secondary | ICD-10-CM

## 2023-02-13 DIAGNOSIS — Z720 Tobacco use: Secondary | ICD-10-CM | POA: Diagnosis not present

## 2023-02-13 DIAGNOSIS — F1099 Alcohol use, unspecified with unspecified alcohol-induced disorder: Secondary | ICD-10-CM | POA: Insufficient documentation

## 2023-02-13 DIAGNOSIS — Z789 Other specified health status: Secondary | ICD-10-CM | POA: Diagnosis not present

## 2023-02-13 DIAGNOSIS — R7989 Other specified abnormal findings of blood chemistry: Secondary | ICD-10-CM | POA: Insufficient documentation

## 2023-02-13 DIAGNOSIS — G894 Chronic pain syndrome: Secondary | ICD-10-CM | POA: Insufficient documentation

## 2023-02-13 DIAGNOSIS — R569 Unspecified convulsions: Secondary | ICD-10-CM | POA: Diagnosis present

## 2023-02-13 DIAGNOSIS — R030 Elevated blood-pressure reading, without diagnosis of hypertension: Secondary | ICD-10-CM

## 2023-02-13 DIAGNOSIS — G8929 Other chronic pain: Secondary | ICD-10-CM | POA: Diagnosis present

## 2023-02-13 DIAGNOSIS — K922 Gastrointestinal hemorrhage, unspecified: Secondary | ICD-10-CM | POA: Diagnosis not present

## 2023-02-13 DIAGNOSIS — R21 Rash and other nonspecific skin eruption: Secondary | ICD-10-CM

## 2023-02-13 DIAGNOSIS — D649 Anemia, unspecified: Secondary | ICD-10-CM | POA: Diagnosis not present

## 2023-02-13 DIAGNOSIS — R238 Other skin changes: Secondary | ICD-10-CM

## 2023-02-13 LAB — CBC
HCT: 35.4 % — ABNORMAL LOW (ref 39.0–52.0)
Hemoglobin: 12.7 g/dL — ABNORMAL LOW (ref 13.0–17.0)
MCH: 32.7 pg (ref 26.0–34.0)
MCHC: 35.9 g/dL (ref 30.0–36.0)
MCV: 91.2 fL (ref 80.0–100.0)
Platelets: 143 10*3/uL — ABNORMAL LOW (ref 150–400)
RBC: 3.88 MIL/uL — ABNORMAL LOW (ref 4.22–5.81)
RDW: 15.8 % — ABNORMAL HIGH (ref 11.5–15.5)
WBC: 5.7 10*3/uL (ref 4.0–10.5)
nRBC: 0 % (ref 0.0–0.2)

## 2023-02-13 LAB — FOLATE: Folate: 7 ng/mL (ref >5.9)

## 2023-02-13 LAB — COMPREHENSIVE METABOLIC PANEL
ALT: 46 U/L — ABNORMAL HIGH (ref 0–44)
AST: 109 U/L — ABNORMAL HIGH (ref 15–41)
Albumin: 4 g/dL (ref 3.5–5.0)
Alkaline Phosphatase: 84 U/L (ref 38–126)
Anion gap: 13 (ref 5–15)
BUN: 5 mg/dL — ABNORMAL LOW (ref 6–20)
CO2: 24 mmol/L (ref 22–32)
Calcium: 8.8 mg/dL — ABNORMAL LOW (ref 8.9–10.3)
Chloride: 103 mmol/L (ref 98–111)
Creatinine, Ser: 0.79 mg/dL (ref 0.61–1.24)
GFR, Estimated: >60 mL/min (ref >60)
Glucose, Bld: 93 mg/dL (ref 70–99)
Potassium: 3.5 mmol/L (ref 3.5–5.1)
Sodium: 140 mmol/L (ref 135–145)
Total Bilirubin: 0.7 mg/dL (ref 0.3–1.2)
Total Protein: 7.3 g/dL (ref 6.5–8.1)

## 2023-02-13 LAB — MAGNESIUM: Magnesium: 2.1 mg/dL (ref 1.7–2.4)

## 2023-02-13 LAB — TSH: TSH: 2.118 u[IU]/mL (ref 0.350–4.500)

## 2023-02-13 LAB — VITAMIN B12: Vitamin B-12: 499 pg/mL (ref 180–914)

## 2023-02-13 MED ORDER — ADULT MULTIVITAMIN W/MINERALS CH
1.0000 | ORAL_TABLET | Freq: Every day | ORAL | Status: DC
  Administered 2023-02-13 – 2023-02-16 (×4): 1 via ORAL
  Filled 2023-02-13 (×4): qty 1

## 2023-02-13 MED ORDER — NICOTINE 14 MG/24HR TD PT24
14.0000 mg | MEDICATED_PATCH | Freq: Every day | TRANSDERMAL | Status: DC
  Administered 2023-02-14 – 2023-02-16 (×3): 14 mg via TRANSDERMAL
  Filled 2023-02-13 (×3): qty 1

## 2023-02-13 MED ORDER — ENOXAPARIN SODIUM 40 MG/0.4ML IJ SOSY
40.0000 mg | PREFILLED_SYRINGE | INTRAMUSCULAR | Status: DC
  Administered 2023-02-13 – 2023-02-15 (×3): 40 mg via SUBCUTANEOUS
  Filled 2023-02-13 (×3): qty 0.4

## 2023-02-13 MED ORDER — THIAMINE HCL 100 MG/ML IJ SOLN
100.0000 mg | Freq: Every day | INTRAMUSCULAR | Status: DC
  Filled 2023-02-13: qty 2

## 2023-02-13 MED ORDER — THIAMINE MONONITRATE 100 MG PO TABS
100.0000 mg | ORAL_TABLET | Freq: Every day | ORAL | Status: DC
  Administered 2023-02-13 – 2023-02-16 (×4): 100 mg via ORAL
  Filled 2023-02-13 (×4): qty 1

## 2023-02-13 MED ORDER — LEVETIRACETAM 500 MG PO TABS: 500.0000 mg | ORAL_TABLET | Freq: Two times a day (BID) | ORAL | Status: DC

## 2023-02-13 MED ORDER — LORAZEPAM 2 MG/ML IJ SOLN: 1.0000 mg | INTRAMUSCULAR | Status: DC | PRN

## 2023-02-13 MED ORDER — LORAZEPAM 1 MG PO TABS: 1.0000 mg | ORAL_TABLET | ORAL | Status: DC | PRN

## 2023-02-13 MED ORDER — METHOCARBAMOL 500 MG PO TABS
1000.0000 mg | ORAL_TABLET | Freq: Two times a day (BID) | ORAL | Status: DC
  Administered 2023-02-13 – 2023-02-16 (×6): 1000 mg via ORAL
  Filled 2023-02-13 (×6): qty 2

## 2023-02-13 MED ORDER — LEVETIRACETAM 500 MG PO TABS
1000.0000 mg | ORAL_TABLET | Freq: Two times a day (BID) | ORAL | Status: DC
  Administered 2023-02-13 – 2023-02-16 (×6): 1000 mg via ORAL
  Filled 2023-02-13 (×6): qty 2

## 2023-02-13 MED ORDER — NAPROXEN 250 MG PO TABS
500.0000 mg | ORAL_TABLET | Freq: Two times a day (BID) | ORAL | Status: DC
  Administered 2023-02-13 – 2023-02-16 (×7): 500 mg via ORAL
  Filled 2023-02-13 (×8): qty 2

## 2023-02-13 MED ORDER — FOLIC ACID 1 MG PO TABS
1.0000 mg | ORAL_TABLET | Freq: Every day | ORAL | Status: DC
  Administered 2023-02-13 – 2023-02-16 (×4): 1 mg via ORAL
  Filled 2023-02-13 (×4): qty 1

## 2023-02-13 NOTE — Assessment & Plan Note (Addendum)
Patient reported GI bleeding yesterday's office visit.  This appears to be chronic.  CBC today with hemoglobin 12.7, MCV 91.2.  Platelets also low at 143.  No concern for continued bleeding at this time.  Differential includes hemorrhoids.  Peutz-Jeghers syndrome less likely though on differential given characteristic lip findings on exam. -FOBT, consider GI consult if positive

## 2023-02-13 NOTE — Progress Notes (Signed)
FMTS Brief Progress Note  S:***   O: BP 100/76 (BP Location: Right Arm)   Pulse 81   Temp 98.2 F (36.8 C) (Oral)   Resp 16   Ht  (1.676 m)   Wt 81 kg   SpO2 99%   BMI 28.82 kg/m     A/P: Refractory seizures Neurology following: Sign for possible celiac disease given patient is generally from an endemic area and constellation of symptoms which include visual acuity, seizures, and skin manage for stations.  ID was consulted who plans to follow-up with patient but suggest ophthalmology assessment and consideration for LP. -Neurology following, appreciate recs -ID consulted, appreciate recs -Will consider ophthalmology consult -Keppra 1 G twice daily -Seizure precautions -Follow-up EEG -MRI brain with and without contrast -Fall precautions -PT/OT eval and treat - Orders reviewed. Labs for AM {ordered not order:23822}, which was adjusted as needed.  - If condition changes, plan includes ***.   Jerre Simon, MD 02/13/2023, 9:21 PM PGY-2, [facility] Night Resident  Please page [phone] with questions.

## 2023-02-13 NOTE — Assessment & Plan Note (Addendum)
Patient reports 2 cans of beer on the weekends, though his sister reports much more frequent use.  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 RUQ [place] with moderate hepatic steatosis, almost certainly from his alcohol use.  -CIWA protocol : have been 0 -TOC consult for substance abuse

## 2023-02-13 NOTE — Assessment & Plan Note (Addendum)
Unknown amount of smoking for unknown amount of time.  He is interested in obtaining nicotine patch here in the hospital. -Nicotine patch 14 mg daily -TOC consult

## 2023-02-13 NOTE — Assessment & Plan Note (Signed)
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 -Continue home Robaxin 1000 mg twice daily and naproxen 500 twice daily -Consulted neurology for further recommendations

## 2023-02-13 NOTE — Consult Note (Signed)
Neurology Consultation  Reason for Consult: Seizures Referring Physician: Dr. Phineas Real  CC: Seizures, vision difficulties  History is obtained from: Patient, chart  HPI: John Krueger is a 32 y.o. male who has had refractory seizures now for a few years.  He was directly admitted from the family medicine service because of inability to get follow-up due to his social issues and daily seizures that were uncontrolled at home. Per chart review, he had seen Dr. Karel Jarvis in 2022 for seizures.  His first seizure was reported May 2021 when he was working the night shift at work.  He was unresponsive and had no recollection of the event.  There was foaming at the mouth reported by EMS.  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  He has had multiple ER visits since then.  Was started on Keppra which she is unable to take due to insurance issues and was lost to follow-up. Has had multiple seizures and multiple ER visits since and reports symptoms where he has pain and stretching on the back of his neck, lower back and ribs and he almost feels that he is immobile, then his whole body shakes and he loses consciousness.  He says sometimes he is able to stop these but most of the times he is unable to stop these episodes. He has had severe neck pain and back pain that has been going on for many years. He has had great difficulty in walking. He has also had trouble with his vision with blurred vision and photophobia as well as worsening acuity over the years.  He has also had multiple issues related to his skin with macules on soles and some hyperpigmented macules on palms and discoloration of his bottom lip.  His tongue also appears to be coated. He has been worked up outpatient with labs for STIs, CMP and CBC which were unremarkable.  Because the outpatient neurology recommendation included obtaining MRI and EEG which she could not do due to insurance reasons, he has  been brought in to complete his workup. He reports a very poor quality of life because of daily seizures that he has which can happen while he is awake or in his sleep.  This has precluded him from maintaining a job or even having a relationship.  He is moved back home with his mother. He is a refugee from [place].  He abuses alcohol and drinks a lot daily.  He has had gait difficulty over the past few years as well.  He has been living in the [place] since 2010 and up until 2021 had no real health issues.  Birth and developmental history was unremarkable.  Does have a history of multiple head injuries and according to the chart, family and friends have reported that he has been having trouble with memory and hallucinations as well.   ROS: Full ROS was performed and is negative except as noted in the HPI.   Past Medical History:  Diagnosis Date   Seizures      History reviewed. No pertinent family history.   Social History:   reports that he has been smoking cigarettes. He has been smoking an average of .5 packs per day. He has never used smokeless tobacco. He reports current alcohol use. He reports that he does not use drugs.  Medications  Current Facility-Administered Medications:    enoxaparin (LOVENOX) injection 40 mg, 40 mg, Subcutaneous, Q24H, Evette Georges, MD, 40 mg  at 02/13/23 1820   folic acid (FOLVITE) tablet 1 mg, 1 mg, Oral, Daily, Mabe, Gerald, MD   levETIRAcetam (KEPPRA) tablet 1,000 mg, 1,000 mg, Oral, BID, Mabe, Gerald, MD   LORazepam (ATIVAN) tablet 1-4 mg, 1-4 mg, Oral, Q1H PRN **OR** LORazepam (ATIVAN) injection 1-4 mg, 1-4 mg, Intravenous, Q1H PRN, Evette Georges, MD   methocarbamol (ROBAXIN) tablet 1,000 mg, 1,000 mg, Oral, BID, Evette Georges, MD   multivitamin with minerals tablet 1 tablet, 1 tablet, Oral, Daily, Mabe, Gerald, MD   naproxen (NAPROSYN) tablet 500 mg, 500 mg, Oral, BID, Mabe, Earvin Hansen, MD   nicotine (NICODERM CQ - dosed in mg/24 hours) patch 14  mg, 14 mg, Transdermal, Daily, Mabe, Gerald, MD   thiamine (VITAMIN B1) tablet 100 mg, 100 mg, Oral, Daily **OR** thiamine (VITAMIN B1) injection 100 mg, 100 mg, Intravenous, Daily, Evette Georges, MD   Exam: Current vital signs: BP 100/76 (BP Location: Right Arm)   Pulse 81   Temp 98.2 F (36.8 C) (Oral)   Resp 16   Ht 5\' 6"  (1.676 m)   Wt 81 kg   SpO2 99%   BMI 28.82 kg/m  Vital signs in last 24 hours: Temp:  [98.2 F (36.8 C)-98.5 F (36.9 C)] 98.2 F (36.8 C) (04/19 2015) Pulse Rate:  [81-103] 81 (04/19 2015) Resp:  [16-18] 16 (04/19 2015) BP: (100-125)/(76-90) 100/76 (04/19 2015) SpO2:  [99 %-100 %] 99 % (04/19 2015) Weight:  [81 kg] 81 kg (04/19 1659) General: Patient is sitting in bed with lights off trying to eat his dinner.  His appearance looks much older than his stated age of 32 years old. HEENT: Normocephalic atraumatic Lungs: Clear Prevascular: Regular rhythm Abdomen nondistended nontender Extremities: He has multiple painful hyperpigmented macules on his soles and on palm as well as on his shin.-Photos are well-documented in the H&P. Neurological exam He is awake alert oriented x 3 He tends to keep his eyes closed when the lights are turned on and has severe photophobia.  He would not cooperate well to check his pupillary reflexes at this time. Speech is not dysarthric He seems to have a hunched posture and some head tremor No aphasia Cranial nerves: Does not cooperate with pupillary examination and closes eye right as light is shown due to severe photophobia.  Extraocular movements are difficult to assess, has a mildly disconjugate gaze but movements do not appear hindered.  He does seem to have constricted visual fields peripherally on both sides to confrontation but again not a very detailed exam at this time.  Facial sensation is intact, face is symmetric, tongue and palate midline. Motor examination with no gross drift noted Sensation: He has hyperesthesia in  his legs and arms to palpation. Could not elicit DTRs properly due to his keeping his muscles tense and did not allow me to stroke the bottom of the sole to check for plantar reflexes due to extreme pain  Labs I have reviewed labs in epic and the results pertinent to this consultation are: CBC    Component Value Date/Time   WBC 5.7 02/13/2023 1750   RBC 3.88 (L) 02/13/2023 1750   HGB 12.7 (L) 02/13/2023 1750   HGB 14.4 01/30/2023 1622   HCT 35.4 (L) 02/13/2023 1750   HCT 42.2 01/30/2023 1622   PLT 143 (L) 02/13/2023 1750   PLT 171 01/30/2023 1622   MCV 91.2 02/13/2023 1750   MCV 92 01/30/2023 1622   MCH 32.7 02/13/2023 1750   MCHC 35.9 02/13/2023 1750  RDW 15.8 (H) 02/13/2023 1750   RDW 15.2 01/30/2023 1622   LYMPHSABS 2.5 01/30/2023 1622   MONOABS 0.8 11/06/2022 2228   EOSABS 0.0 01/30/2023 1622   BASOSABS 0.0 01/30/2023 1622    CMP     Component Value Date/Time   NA 140 02/13/2023 1750   NA 140 01/30/2023 1622   K 3.5 02/13/2023 1750   CL 103 02/13/2023 1750   CO2 24 02/13/2023 1750   GLUCOSE 93 02/13/2023 1750   BUN 5 (L) 02/13/2023 1750   BUN 4 (L) 01/30/2023 1622   CREATININE 0.79 02/13/2023 1750   CALCIUM 8.8 (L) 02/13/2023 1750   PROT 7.3 02/13/2023 1750   PROT 7.7 01/30/2023 1622   ALBUMIN 4.0 02/13/2023 1750   ALBUMIN 4.8 01/30/2023 1622   AST 109 (H) 02/13/2023 1750   ALT 46 (H) 02/13/2023 1750   ALKPHOS 84 02/13/2023 1750   BILITOT 0.7 02/13/2023 1750   BILITOT 0.3 01/30/2023 1622   GFRNONAA >60 02/13/2023 1750   GFRAA >60 03/05/2020 2349  RPR and treponema pallidum antibody EIA negative. HCV negative  His Keppra levels were never checked have been undetectable indicating noncompliance  Imaging I have reviewed the images obtained:  CT-head CT head November 07, 2022  Assessment: 32 year old man who moved to the [place] from [place] 14 years ago, with no significant past medical history, since 2021 has started having generalized  convulsions that have been difficult to control-mostly because of noncompliance to medications but even while on medications had had multiple breakthrough seizures. In the past few weeks to months has had multiple seizures that have been extremely difficult to control-has daily seizures.  On examination appears much older than stated age, graying hair, has multiple hyperpigmented macules on his palms and soles and calfs which are extremely painful.  Also has neuropathic pain in bilateral lower extremities worse than upper extremities and chronic back pain that is also unremitting. Given the fact that he has seizures, cutaneous hyperpigmented macules and also has experienced vision difficulties and difficulties with memory as well as a CT scan of the head done earlier this year that shows generalized atrophy disproportionate to age, he needs further workup with imaging and other testing. 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  He has been worked up for STIs already so it is less likely to be neurosyphilis.  A primary neurodegenerative process is also in the realm of possibility.  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  I would do this after the MRI of the brain with and without contrast is completed.  Would like to check glucose, protein, cell count, cytology and flow cytometry along with Gram stain and cultures in the CSF. Preliminary plan was discussed with Dr. Phineas Real Recommendations made  to contact ID to ensure no additional isolation measures need to be taken given the concern for filarial illness  Neurology will follow -- Milon Dikes, MD Neurologist [facility] Pager: [phone]

## 2023-02-13 NOTE — H&P (Cosign Needed Addendum)
Hospital Admission History and Physical Service Pager: [phone]  Patient name: John Krueger Medical record number: [id number] Date of Birth: 1991-03-01 Age: 32 y.o. Gender: male  Primary Care Provider: Evette Georges, MD Consultants: Neurology Code Status: Full Preferred Emergency Contact:  Contact Information     Name Relation Home Work Mobile   Aristilde,Beltha Sister [phone]     Gaynelle Cage Mother   [phone]      Chief Complaint: Seizures  Assessment and Plan: John Krueger is a 32 y.o. male presenting with refractory seizure-like activity. Differential for this patient's presentation of this includes primary epilepsy (more likely overall), neurocutaneous disorder (more likely given dermatological findings below in context of seizure-like activity), vasculitis (more likely given cutaneous findings in the context of seizure, though CT head demonstrated only atrophy advanced for age, and MRI may be more sensitive), infection (less likely given negative STI workup), metabolic abnormality (less likely given overall normal CMP), substance use (less likely given report of no other substance use other than alcohol and tobacco, and sister's report of chronic alcohol use likely would not lead to this constellation of symptoms), and intracranial mass/bleed (less likely given normal neurological exam today and lack of suggestive findings on CT head without contrast).  * Seizures Stable today without episodes.  Patient has been taking Keppra 500 twice daily per report.  CT head without contrast with general atrophy advanced for age. Consulted neurology for further recommendations while admitted to [facility] attending Dr. Lum Babe. -Increase Keppra to 1000 mg twice daily per neuro -Neurology following, appreciate assistance -Vitamin B1, vitamin B12, folate, TSH  -Seizure precautions -Vitals per unit routine  GI bleeding Patient reported GI bleeding yesterday's office visit.  This appears to be  chronic.  CBC today with hemoglobin 12.7, MCV 91.2.  Platelets also low at 143.  No concern for continued bleeding at this time.  Differential includes hemorrhoids; Peutz-Jeghers syndrome less likely though on differential given characteristic lip findings on exam. -FOBT, consider GI consult if positive  Alcohol use Patient reports 2 cans of beer on the weekends, though his sister reports much more frequent use.  Elevated LFTs encouraged to try patient could reflect alcoholic hepatitis. -CIWA protocol -TOC consult  Chronic pain Most likely neuropathic in the setting of neurological dysfunction. -Continue home Robaxin 1000 mg twice daily and naproxen 500 twice daily -Consulted neurology for further recommendations  Tobacco use Unknown amount of smoking for unknown amount of time.  He is interested in obtaining nicotine patch here in the hospital. -Nicotine patch 14 mg daily -TOC consult  Elevated LFTs LFTs elevated in the past 102 AST to 54 ALT.  This could signal alcoholic hepatitis in context of history provided by sister of chronic alcohol use.  Most recent CMP with AST 74 and ALT 40.  Updated CMP in progress. -Follow-up CMP -Consider right upper quadrant ultrasound  Elevated blood pressure reading BP elevated today.  No diagnosis of hypertension. -Monitor BP for now, consider antihypertensive  FEN/GI: Regular diet VTE Prophylaxis: Lovenox Dispo: Home pending TOC aid, pending clinical improvement  Disposition: Med-surg  History of Present Illness:  John Krueger is a 32 y.o. male presenting with daily seizures refractory to outpatient management.  Per his neurologist which he seen in 08/2021: "This is a 32 year old right-handed man presenting for evaluation of seizures. He is accompanied by his friend who helps supplement the history today. He is a poor historian, having difficulty clarifying answers and saying "I can't answer that" when further clarification is  asked. ER notes  were reviewed, he has had multiple ER visits for seizures and pain. The first episode occurred on 03/05/20 while on night shift at the local [employer], he was taking a smoking break and started feeling different and dizzy. His co-workers reported he was unresponsive for 2 minutes no motor activity noted. He was amnestic of the event. 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 Since then, he has had several ER visits for seizures. He reports symptoms start from the back of his neck, his lower back and ribs burn, he cannot move his right arm and leg, and he feels his body vibrate before he loses consciousness. Sometimes he is able to stop the symptoms when he tries to get up and would not lose consciousness. He denies any focal weakness/numbness/tingling after the seizures. He cannot talk and would not remember anything afterwards. He has bitten his tongue and had urinary incontinence with them. It appears he was event-free for a year until 03/12/21 while in the court house sitting on the bench next to his attorney when he had a tonic-clonic seizure lasting 20 seconds, post-ictal when EMS arrived. He was started on Levetiracetam 500mg  BID at that time. He was in the ER 10 days later for muscle pain, CK was 1128. He had ER visits for seizures on 04/26/21, 06/12/21 (while in jail, lasting 30 seconds), 07/12/21 (dose increased to 1000mg  in AM, 500mg  in PM but he comes today taking 500mg  BID), and most recently 08/25/21 when he reported 2 seizures in 2 days. He may have missed medication the week prior. His CK level was 716. He is concerned about the seizures, but also very concerned about his pain. He is constantly complaining of back pain. He has constipation, no incontinence. He has difficulty sleeping, when the pain is worse, he has to walk around. He has difficulty saying if he has any  olfactory/gustatory hallucinations. He forgets his age, his friend notes "there is something wrong with his memory." He lives with his mother. They note he had a rough life with many head injuries, no neurosurgical procedures. He had a normal birth and early development.  There is no history of febrile convulsions, CNS infections such as meningitis/encephalitis, or family history of seizures."  Patient has followed up with me since 01/15/2023 for his seizures.  Neurological exam at that time was normal, and I referred him to a neurologist who takes Medicaid.  He has an appointment scheduled for October of this year.  On 01/30/2023 for follow-up, I appreciated multiple dermatological findings of hyperpigmented macules on bilateral soles with sparse hyperpigmented macules on bilateral palms, hypopigmentation of the bottom lip, and a thin white film overlying his tongue.  His symptoms continued, and I prescribed him Keppra 500 twice daily.  I also obtained multiple STI labs, CMP, and CBC.  His labs are overall unremarkable.  On follow-up yesterday, 4/18, has not had any improvement with Keppra dosing.  He also endorsed some GI bleeding that appeared to be chronic. He also endorsed difficulty with visual fields on my exam yesterday with ability to mainly see numbers I held up when they were in center of vision.  Given his seizures are affecting his daily activity, and the fact he was not able to see a specialist for multiple months, recommended direct admission today for neurologic consultation, MRI, and  likely overnight EEG.  Today, patient is very grateful and tearful for [place] getting him into the hospital for this workup to continue living his [place] dream.  He recounts how the seizures have wrecked everything in his life.  He can no longer have sex with intimate partners, he can no longer work, and he can no longer afford to have his own apartment, so he is living with his mother.  He states he has not had a  seizure today.  Of note, his sister told his nurse that he drinks "a lot" of alcohol daily-to the point of slurring his words and being off balance.  However, she was not in the room when I arrived.  Review Of Systems: Per HPI  Pertinent Past Medical History: Chronic pain Dental decay Remainder reviewed in history tab.   Pertinent Past Surgical History: None per chart review  Pertinent Social History: Tobacco use: Smoked varying amounts for multiple years, though he is unsure of how long Alcohol use: Patient reports 2 cans of beer on the weekends Other Substance use: None Lives with mother  Pertinent Family History: No history of seizures in his family Remainder reviewed in history tab.   Important Outpatient Medications: Keppra 500 mg BID Remainder reviewed in medication history.   Objective: BP (!) 125/90 (BP Location: Left Arm)   Pulse (!) 103   Temp 98.5 F (36.9 C) (Oral)   Resp 18   Ht  (1.676 m)   Wt 81 kg   SpO2 100%   BMI 28.82 kg/m  Exam: General: Alert and oriented, in NAD, graying hair Skin: Warm, dry, and intact; multiple painful hyperpigmented macules on bilateral soles, singular nodule near middle of the sole that is painful to palpation, sparse hyperpigmented macules on bilateral palms HEENT: NCAT, EOM grossly normal, midline nasal septum, bottom lip with areas of hypopigmentation, tongue with thin white film overlying that appears slightly improved from prior, globally poor dentition Cardiac: RRR, no m/r/g appreciated Respiratory: CTAB, breathing and speaking comfortably on RA Neurological: No gross focal deficit; strength and sensation intact throughout, he walks with a limp; defective visual peripheral fields on my exam yesterday erroneously not documented Psychiatric: Appropriate mood and affect    01/30/2023 photos:           Labs 01/2023 AST 102>74 BUN Less than 5>4 STI screening negative CBC WNL  EKG 12/2021 Sinus tachycardia  without STEMI/BBB (my interpretation)  CT head wo contrast 10/2022 IMPRESSION: 1. No acute intracranial abnormality. 2. Moderate diffuse parenchymal volume loss, advanced given the patient's age, stable since remote prior examination. 3. Disconjugate gaze. I agree with above interpretation.  Janeal Holmes, MD 02/13/2023, 7:42 PM PGY-1, [facility] Family Medicine FPTS Intern pager: [phone], text pages welcome Secure chat group [facility] Teaching Service

## 2023-02-13 NOTE — Assessment & Plan Note (Signed)
LFTs elevated in the past 102 AST to 54 ALT.  This could signal alcoholic hepatitis in context of history provided by sister of chronic alcohol use.  Most recent CMP with AST 74 and ALT 40.  Updated CMP in progress. -Follow-up CMP -Consider right upper quadrant ultrasound

## 2023-02-13 NOTE — Assessment & Plan Note (Signed)
BP elevated today.  No diagnosis of hypertension. -Monitor BP for now, consider antihypertensive
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
## 2023-02-13 NOTE — Assessment & Plan Note (Addendum)
Stable today without episodes.  Patient has been taking Keppra 500 twice daily per report.  CT head without contrast with general atrophy advanced for age. Consulted neurology for further recommendations while admitted to [facility] attending Dr. Lum Babe. -Consult neurology, appreciate recommendations, consider overnight EEG and MRI -Continue Keppra 1000 mg twice daily per neuro -Vitamin B1, vitamin B12, folate, TSH -Seizure precautions -Vitals per unit routine

## 2023-02-14 ENCOUNTER — Observation Stay (HOSPITAL_COMMUNITY): Payer: Medicaid Other

## 2023-02-14 DIAGNOSIS — R238 Other skin changes: Secondary | ICD-10-CM

## 2023-02-14 DIAGNOSIS — G40909 Epilepsy, unspecified, not intractable, without status epilepticus: Secondary | ICD-10-CM

## 2023-02-14 DIAGNOSIS — Z789 Other specified health status: Secondary | ICD-10-CM | POA: Diagnosis not present

## 2023-02-14 DIAGNOSIS — R569 Unspecified convulsions: Secondary | ICD-10-CM | POA: Diagnosis not present

## 2023-02-14 LAB — COMPREHENSIVE METABOLIC PANEL
ALT: 43 U/L (ref 0–44)
AST: 100 U/L — ABNORMAL HIGH (ref 15–41)
Albumin: 3.8 g/dL (ref 3.5–5.0)
Alkaline Phosphatase: 81 U/L (ref 38–126)
Anion gap: 9 (ref 5–15)
BUN: 5 mg/dL — ABNORMAL LOW (ref 6–20)
CO2: 24 mmol/L (ref 22–32)
Calcium: 9.2 mg/dL (ref 8.9–10.3)
Chloride: 104 mmol/L (ref 98–111)
Creatinine, Ser: 0.84 mg/dL (ref 0.61–1.24)
GFR, Estimated: >60 mL/min (ref >60)
Glucose, Bld: 73 mg/dL (ref 70–99)
Potassium: 3.3 mmol/L — ABNORMAL LOW (ref 3.5–5.1)
Sodium: 137 mmol/L (ref 135–145)
Total Bilirubin: 0.5 mg/dL (ref 0.3–1.2)
Total Protein: 7.1 g/dL (ref 6.5–8.1)

## 2023-02-14 LAB — RAPID URINE DRUG SCREEN, HOSP PERFORMED
Amphetamines: NOT DETECTED
Barbiturates: NOT DETECTED
Benzodiazepines: NOT DETECTED
Cocaine: NOT DETECTED
Opiates: NOT DETECTED
Tetrahydrocannabinol: NOT DETECTED

## 2023-02-14 LAB — IRON AND TIBC
Iron: 144 ug/dL (ref 45–182)
Saturation Ratios: 36 % (ref 17.9–39.5)
TIBC: 399 ug/dL (ref 250–450)
UIBC: 255 ug/dL

## 2023-02-14 LAB — MISC LABCORP TEST (SEND OUT): Labcorp test code: 70115

## 2023-02-14 LAB — CBC
HCT: 36.4 % — ABNORMAL LOW (ref 39.0–52.0)
Hemoglobin: 12.5 g/dL — ABNORMAL LOW (ref 13.0–17.0)
MCH: 32.1 pg (ref 26.0–34.0)
MCHC: 34.3 g/dL (ref 30.0–36.0)
MCV: 93.3 fL (ref 80.0–100.0)
Platelets: 125 10*3/uL — ABNORMAL LOW (ref 150–400)
RBC: 3.9 MIL/uL — ABNORMAL LOW (ref 4.22–5.81)
RDW: 15.9 % — ABNORMAL HIGH (ref 11.5–15.5)
WBC: 4.6 10*3/uL (ref 4.0–10.5)
nRBC: 0 % (ref 0.0–0.2)

## 2023-02-14 LAB — RETICULOCYTES
Immature Retic Fract: 6.9 % (ref 2.3–15.9)
RBC.: 3.94 MIL/uL — ABNORMAL LOW (ref 4.22–5.81)
Retic Count, Absolute: 38.2 10*3/uL (ref 19.0–186.0)
Retic Ct Pct: 1 % (ref 0.4–3.1)

## 2023-02-14 LAB — FERRITIN: Ferritin: 76 ng/mL (ref 24–336)

## 2023-02-14 MED ORDER — B COMPLEX-C PO TABS
1.0000 | ORAL_TABLET | Freq: Every day | ORAL | Status: DC
  Administered 2023-02-14 – 2023-02-16 (×3): 1 via ORAL
  Filled 2023-02-14 (×3): qty 1

## 2023-02-14 MED ORDER — POTASSIUM CHLORIDE CRYS ER 20 MEQ PO TBCR
40.0000 meq | EXTENDED_RELEASE_TABLET | Freq: Once | ORAL | Status: AC
  Administered 2023-02-14: 40 meq via ORAL
  Filled 2023-02-14: qty 2

## 2023-02-14 MED ORDER — GADOBUTROL 1 MMOL/ML IV SOLN
8.0000 mL | Freq: Once | INTRAVENOUS | Status: AC | PRN
  Administered 2023-02-14: 8 mL via INTRAVENOUS

## 2023-02-14 NOTE — Progress Notes (Addendum)
The patient is med-surge with no tele order and wearing the tele. Notified the MD on call to get an order since patient is here for seizure. I was told by Dr. Theodis Aguas to take the tele off. Tele monitor removed from patient.

## 2023-02-14 NOTE — Procedures (Signed)
History: 32 year old male being evaluated for seizure disorder  Sedation: None  Technique: This EEG was acquired with electrodes placed according to the International 10-20 electrode system (including Fp1, Fp2, F3, F4, C3, C4, P3, P4, O1, O2, T3, T4, T5, T6, A1, A2, Fz, Cz, Pz). The following electrodes were missing or displaced: none.   Background: The background consists of intermixed alpha and beta activities. There is a well defined posterior dominant rhythm of eight hz that attenuates with eye opening. Sleep is recorded with normal appearing structures.   Photic stimulation: Physiologic driving is not performed  EEG Abnormalities: None  Clinical Interpretation: This normal EEG is recorded in the waking and sleep state. There was no seizure or seizure predisposition recorded on this study. Please note that lack of epileptiform activity on EEG does not preclude the possibility of epilepsy.   Ritta Slot, MD [facility] [phone]  If 7pm- 7am, please page neurology on call as listed in AMION.

## 2023-02-14 NOTE — Evaluation (Signed)
Occupational Therapy Evaluation Patient Details Name: John Krueger MRN: [id number] DOB: 08/30/1991 Today's Date: 02/14/2023   History of Present Illness 32 Y/O M with PMHx of Seizure disorder and chronic alcohol use presents with hx of daily recurrent seizures, vision loss, memory loss, and skin lesions ongoing for >1 and symptoms are progressive. Pt also reports photophobia as well as chronic recurrent headaches, especially with light exposure.   Clinical Impression   Pt presents at min guard A level with mobility walking to bathroom and stepping in/out of shower and LB ADLs. Pt with slow, guarded pace of movement and reports headache pain 8/10 and that vision is impaired by seeing all people and objects moving although still. No LOB or incoordination demonstrated during functional mobility walking in his room with no AD. PTA pt lived at home with family members and was Ind with ADLs/selfcare, IADLs, cooking and mobility. Pt would benefit from acute OT services to maximize level of function and safety     Recommendations for follow up therapy are one component of a multi-disciplinary discharge planning process, led by the attending physician.  Recommendations may be updated based on patient status, additional functional criteria and insurance authorization.   Assistance Recommended at Discharge PRN  Patient can return home with the following Assist for transportation;Assistance with cooking/housework;A little help with bathing/dressing/bathroom;A little help with walking and/or transfers    Functional Status Assessment  Patient has had a recent decline in their functional status and demonstrates the ability to make significant improvements in function in a reasonable and predictable amount of time.  Equipment Recommendations  None recommended by OT    Recommendations for Other Services PT consult     Precautions / Restrictions Precautions Precautions: Fall;Other (comment) Precaution  Comments: seizures Restrictions Weight Bearing Restrictions: No      Mobility Bed Mobility Overal bed mobility: Independent                  Transfers Overall transfer level: Needs assistance   Transfers: Sit to/from Stand, Bed to chair/wheelchair/BSC Sit to Stand: Min guard           General transfer comment: min guard A for safety, pt with slow, guarded pace of movement, no LOB observed      Balance Overall balance assessment: Mild deficits observed, not formally tested                                         ADL either performed or assessed with clinical judgement   ADL Overall ADL's : Needs assistance/impaired                                       General ADL Comments: slow, guarded pace of movement during tasks. Pt doffs shoes seated EOB with no assist, dons pants min guard A sit- stand. Pt walks to bathroom with no AD min guard A, steps in and out of shower min guard A,     Vision Ability to See in Adequate Light: 1 Impaired Patient Visual Report: Other (comment) (pt reports people and objects appear to be moving, although still) Additional Comments: pt reports people and objects appear to be moving, although still     Perception     Praxis      Pertinent Vitals/Pain Pain Assessment Pain Assessment: 0-10  Pain Score: 8  Pain Location: headache Pain Descriptors / Indicators: Aching, Throbbing Pain Intervention(s): Monitored during session, Repositioned     Hand Dominance Right   Extremity/Trunk Assessment Upper Extremity Assessment Upper Extremity Assessment: Overall WFL for tasks assessed   Lower Extremity Assessment Lower Extremity Assessment: Defer to PT evaluation   Cervical / Trunk Assessment Cervical / Trunk Assessment: Normal   Communication Communication Communication: No difficulties   Cognition Arousal/Alertness: Awake/alert Behavior During Therapy: WFL for tasks assessed/performed Overall  Cognitive Status: Within Functional Limits for tasks assessed                                       General Comments       Exercises     Shoulder Instructions      Home Living Family/patient expects to be discharged to:: Private residence Living Arrangements: Parent;Other relatives (nephews) Available Help at Discharge: Family;Available PRN/intermittently Type of Home: House Home Access: Stairs to enter [employer] of Steps: 3-4   Home Layout: One level     Bathroom Shower/Tub: Producer, television/film/video: Standard     Home Equipment: None          Prior Functioning/Environment Prior Level of Function : Independent/Modified Independent             Mobility Comments: pt Ind, uses no ADL for mobility ADLs Comments: Ind with ADLs, IADLs, cooking        OT Problem List: Decreased activity tolerance;Pain;Decreased coordination      OT Treatment/Interventions: Self-care/ADL training;Balance training;Therapeutic activities;Patient/family education    OT Goals(Current goals can be found in the care plan section) Acute Rehab OT Goals Patient Stated Goal: "feel better" OT Goal Formulation: With patient Time For Goal Achievement: 02/28/23 ADL Goals Pt Will Perform Grooming: with supervision;with set-up;with modified independence;standing Pt Will Perform Lower Body Bathing: with supervision;with set-up;with modified independence;sit to/from stand Pt Will Perform Lower Body Dressing: with supervision;with set-up;with modified independence;sit to/from stand Pt Will Transfer to Toilet: with supervision;with modified independence;ambulating;regular height toilet Pt Will Perform Toileting - Clothing Manipulation and hygiene: with supervision;with modified independence;sit to/from stand Pt Will Perform Tub/Shower Transfer: with supervision;with modified independence;ambulating  OT Frequency: Min 2X/week    Co-evaluation               AM-PAC OT "6 Clicks" Daily Activity     Outcome Measure Help from another person eating meals?: None Help from another person taking care of personal grooming?: A Little Help from another person toileting, which includes using toliet, bedpan, or urinal?: A Little Help from another person bathing (including washing, rinsing, drying)?: A Little Help from another person to put on and taking off regular upper body clothing?: None Help from another person to put on and taking off regular lower body clothing?: A Little 6 Click Score: 20   End of Session Equipment Utilized During Treatment: Gait belt  Activity Tolerance: Patient tolerated treatment well Patient left: in bed;with call bell/phone within reach;with bed alarm set  OT Visit Diagnosis: Unsteadiness on feet (R26.81);Other abnormalities of gait and mobility (R26.89);Pain Pain - part of body:  (headache)                Time: 1610-9604 OT Time Calculation (min): 27 min Charges:  OT Evaluation $OT Eval Low Complexity: 1 Low OT Treatments $Therapeutic Activity: 8-22 mins  Galen Manila 02/14/2023, 12:20 PM

## 2023-02-14 NOTE — Progress Notes (Addendum)
EEG complete - results pending 

## 2023-02-14 NOTE — Consult Note (Signed)
Chief Complaint/Reason for Consultation: Vision changes with associated onset of seizures  HPI: 32 yo M admitted for workup of seizures uncontrolled at home. Ophthalmology consultation requested due to concern for possible filarial illness. Patient is originally from [place], moved to the [place] 14 years ago.  Patient notes a gradual decrease in his vision OU over the last 3-4 years with the onset of his seizures. He denies any acute change in this. He notes he has never had an eye exam with an eye doctor. He also c/o watering and mild photophobia for several years. He c/o photopsias OU, about 1 per week. He c/o monocular diplopia OU and notes that all these symptoms have been present for years without any acute change. He denies floaters, eye pain, or episodes of redness in the eyes. He has never had a glasses prescription, but has tried OTC reading glasses at times, without much help to his vision.   ROS: otherwise as in HPI  Patient Active Problem List   Diagnosis Date Noted   Other skin changes 02/14/2023   GI bleeding 02/13/2023   Alcohol use 02/13/2023   Tobacco use 02/13/2023   Seizure disorder 01/15/2023   Chronic pain 01/15/2023   Dental decay 01/15/2023   No current facility-administered medications on file prior to encounter.   Current Outpatient Medications on File Prior to Encounter  Medication Sig Dispense Refill   levETIRAcetam (KEPPRA) 500 MG tablet Take 3 tablets (1,500 mg total) by mouth 2 (two) times daily. (Patient taking differently: Take 500 mg by mouth 2 (two) times daily.) 180 tablet 3   naproxen (NAPROSYN) 500 MG tablet Take 1 tablet (500 mg total) by mouth 2 (two) times daily. (Patient not taking: Reported on 02/14/2023) 30 tablet 0   potassium chloride SA (KLOR-CON M) 20 MEQ tablet Take 1 tablet (20 mEq total) by mouth 2 (two) times daily. (Patient not taking: Reported on 11/06/2022) 14 tablet 0  No Known Allergies   EXAMINATION  VAsc (near): OD: 20/60  , PH  20/25 OS: 20/60 , PH 20/60+2  Pupils:  OD: Equal, round, reactive, no APD OS: Equal, round, reactive, no APD  T(iCare): OD: 24   mm Hg OS: 22  mm Hg  Color vision testing (ishihara) OD: 11/12 OS 11/12  CVF: full to finger counting OU, though limited patient participation EOM: full OU, no limitation or restriction OU, orthotropic and orthophoric on alternate cover testing OU  Anterior Segment Exam (20D) lens and indirect @ Bedside: Ext/Lids: normal OU Conj/Sclera: white and quiet OU Cornea: OD clear, no abrasion or infiltrate  OS clear centrally, mild subepithelial scarring inferonasal AC: Deep and Quiet OU Iris: Round and Flat OU Lens: Clear OU  Dilated OU with phenylephrine and tropicamide OU @ 11:55 hours  Dilated Fundus Exam: Vitreous: Clear OU, no vitritis noted OU Disc: sharp and pink without edema and with 0.3 c/d OU Macula: flat and dry OU, no retintitis or chorioretinal infiltrates OU Vessels: normal distribution OU, perfused OU, no vascular sheathing OU Periphery: flat and attached 360 without breaks or tears OU, no chorioretinal infiltrates OU   Imp/Plan:  Ocular Exam for suspected filarial disease No significant ocular signs to indicate uveitis/iritis/chorioretinitis. No evidence of optic neuropathy. Mild corneal scarring OS would be better evaluated by a full slit lamp exam in office, though is mild enough to not likely indicate any significant ocular disease history.  CT noted diffuse volume loss greater than expected given patients age. Also noted disconjugate gaze (probably exophoric on  my review of images, common when eyes are closed.). No significant tropias noted on clinical exam.  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 Goldman applanation in clinic is the gold standard for pressure measurement and would recommend this after discharge along with assessment of glaucoma risk factors, non-urgent  3. Monocular Diplopia OU -  likely refractive error/myopia, recommend refraction and eye exam after discharge, non-urgent  Dispo: Ophthalmology will sign off unless further questions or concerns arise. Recommend non-urgent office exam as noted above.    Shon Millet, M.D. Ophthalmology [facility]

## 2023-02-14 NOTE — Progress Notes (Signed)
Daily Progress Note Intern Pager: [phone]  Patient name: John Krueger Medical record number: [id number] Date of birth: [birth year] Age: 32 y.o. Gender: male  Primary Care Provider: Evette Georges, MD Consultants: Neurology, ID, ophthalmology Code Status: Full  Pt Overview and Major Events to Date:  4/19- direct admit to FMTS  Assessment and Plan: John Krueger is a 32 year old male presenting with sudden onset seizure-like activity and vision changes.  Workup is ongoing and differential remains broad including nutritional deficiency, neurocutaneous disorder, vasculitis, infection (particular c/f filarial dz), and substance abuse d/o.  Pertinent PMH/PSH includes refugee status (from [place] since 2010).  * Seizure disorder Remains undifferentiated. Appreciate neuro's input yesterday. Suspicion for nutrient deficiency vs indolent infection vs autoimmune process such as a vasculitis (though seeming less likely with nl MRI yesterday.  Neuro has  raised concern for filarial dz given concomitant seizures, cerebral atrophy and vision changes.  -EEG completed ON, awaiting epileptologist interpretation -Likely to need LP if workup remains unrevealing -Continue Keppra 1000 mg twice daily -B12, folate, TSH all WNL, B1 pending. -Adding B6, Vit 6, Zinc, and a full anemia panel to assess for iron deficiency  -B3 deficiency is a possible explanation for neurodegenerative changes and acral dermatitis, but his dermatitis is atypical. Similarly,   B2 would account for perioral changes and anemia. Unfortunately these are both send-out labs. Labs sent and will empirically start B complex vitamin.  -Seizure precautions -Vitals per unit routine -Dr. Sherrine Maples, ophtho to see for eye exam -ID consulted by our night team, will see  Other skin changes Lesions of the palms/soles with some lesions particularly c/f Janeway lesions. Neg STI workup including RPR. No fever or leukocytosis, but does have some arthralgias  and with refugee status, possible hx of rheumatic fever/heart disease. - Echocardiogram to assess for possible endocarditis  GI bleeding Patient reported GI bleeding during recent office visit.  None overnight.  Differential includes hemorrhoids.  Peutz-Jeghers syndrome less likely though on differential given characteristic lip findings on exam. -FOBT, consider GI consult if positive  Alcohol use Patient reports 2 cans of beer on the weekends, though his sister reports much more frequent use.  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 RUQ [place] with moderate hepatic steatosis, almost certainly from his alcohol use.  -CIWA protocol -TOC consult for substance abuse  Chronic pain Most likely neuropathic in the setting of neurological dysfunction. -Continue home Robaxin 1000 mg twice daily and naproxen 500 twice daily -Consulted neurology for further recommendations  Tobacco use Unknown amount of smoking for unknown amount of time.  He is interested in obtaining nicotine patch here in the hospital. -Nicotine patch 14 mg daily -TOC consult       FEN/GI: Regular PPx: Lovenox Dispo:Home pending clinical improvement . Barriers include ongoing neurologic workup.   Subjective:  Tells me that he continues to feel about the same as previous.  He denies any true seizure activity overnight last night, though he did have a tingling sensation in his hands and feet which is something that he experiences commonly as a prodrome to his seizures.  He is not hungry this morning.  Objective: Temp:  [98.2 F (36.8 C)-98.5 F (36.9 C)] 98.4 F (36.9 C) (04/20 0900) Pulse Rate:  [65-103] 71 (04/20 0900) Resp:  [16-18] 18 (04/20 0900) BP: (100-128)/(76-90) 128/85 (04/20 0900) SpO2:  [96 %-100 %] 98 % (04/20 0900) Weight:  [81 kg] 81 kg (04/19 1659) Physical Exam: General: A&O, NAD Cardiovascular:  RRR, without murmur  Respiratory: Normal WOB on RA, lungs clear  throughout Abdomen: Soft, non-tender, non-distended Neuro: Loss of peripheral visual fields Skin: Macular lesions to palms and soles unchanged; perioral lesions are improved on my exam today compared to  baseline photos in chart   Laboratory: Most recent CBC Lab Results  Component Value Date   WBC 4.6 02/14/2023   HGB 12.5 (L) 02/14/2023   HCT 36.4 (L) 02/14/2023   MCV 93.3 02/14/2023   PLT 125 (L) 02/14/2023   Most recent BMP    Latest Ref Rng & Units 02/14/2023    4:11 AM  BMP  Glucose 70 - 99 mg/dL 73   BUN 6 - 20 mg/dL 5   Creatinine 9.14 - 7.82 mg/dL 9.56   Sodium 213 - 086 mmol/L 137   Potassium 3.5 - 5.1 mmol/L 3.3   Chloride 98 - 111 mmol/L 104   CO2 22 - 32 mmol/L 24   Calcium 8.9 - 10.3 mg/dL 9.2      Imaging/Diagnostic Tests: US Abdomen RUQ IMPRESSION: 1. No acute abnormality. 2. Moderate hepatic steatosis.  MR Brain W WO  IMPRESSION: 1. No acute intracranial abnormality. 2. Generalized volume loss.  Independently reviewed and agree with radiologist interpretation  Alicia Amel, MD 02/14/2023, 9:28 AM  PGY-2, [facility] FPTS Intern pager: [phone], text pages welcome Secure chat group [facility] Teaching Service

## 2023-02-14 NOTE — Consult Note (Signed)
[facility]       Reason for Consult: rash    Referring Physician: Dr. Lum Babe  Principal Problem:   Seizure disorder Active Problems:   Chronic pain   GI bleeding   Alcohol use   Tobacco use   Other skin changes    B-complex with vitamin C  1 tablet Oral Daily   enoxaparin (LOVENOX) injection  40 mg Subcutaneous Q24H   folic acid  1 mg Oral Daily   levETIRAcetam  1,000 mg Oral BID   methocarbamol  1,000 mg Oral BID   multivitamin with minerals  1 tablet Oral Daily   naproxen  500 mg Oral BID   nicotine  14 mg Transdermal Daily   thiamine  100 mg Oral Daily   Or   thiamine  100 mg Intravenous Daily    Recommendations:  Consider skin biopsy Agree with hepatitis B serology, vitamin deficiency evaluation   Assessment: He has a constellation of visual defects, possible seizure disorder, cerebral volume loss and nodular rash with pruritus.  From an infection standpoint, to particular infectious work up indicated.  I have a low suspicion for filarial/onchocercosis as he has been here nearly 15 years which is the longest typical life span, his eye exam is negative and the clinical syndrome does not fit.  His HIV is negative.  You could do a skin biopsy at the nodules for diagnosis and could include culture for AFB, fungal as well as path.    His seizure history is not typical.  He does not describe post ictal states and has not been witnessed.   He has thrombocytopenia of unclear etiology.  With his alcohol history, cirrhosis is of concern.  Ultrasound ok.  Checking hepatitis B labs.  Hepatitis C negative.  Agree with TTE to rule out high output heart failure  HPI: John Krueger is a 32 y.o. male from [place] and has lived in the [place] for 14 years was admitted for ongoing issues with seizure disorder, rash and visual changes for several years.  He has multiple ED visits and recently established with family medicine and here for evaluation of ongoing  seizures.  Noted to have hyperpigmented nodular rashes throughout and has complained about chronic itchy skin.     Review of Systems:  Constitutional: negative for fevers and chills All other systems reviewed and are negative    Past Medical History:  Diagnosis Date   Seizures     Social History   Tobacco Use   Smoking status: Every Day    Packs/day: .5    Types: Cigarettes   Smokeless tobacco: Never  Vaping Use   Vaping Use: Never used  Substance Use Topics   Alcohol use: Yes    Comment: sister states that he drinks a lot of liquor   Drug use: No    History reviewed. No pertinent family history.  No Known Allergies  Physical Exam: Constitutional: in no apparent distress  Vitals:   02/14/23 0900 02/14/23 1206  BP: 128/85 128/80  Pulse: 71 60  Resp: 18 18  Temp: 98.4 F (36.9 C) 98.7 F (37.1 C)  SpO2: 98% 99%   EYES: anicteric ENMT: white plaque Cardiovascular: RRR Respiratory: normal respiratory effort GI: soft Musculoskeletal: no edema Skin: multiple small, circular palpable hyperpigmented nodules   Lab Results  Component Value Date   WBC 4.6 02/14/2023   HGB 12.5 (L) 02/14/2023   HCT 36.4 (L) 02/14/2023   MCV 93.3 02/14/2023  PLT 125 (L) 02/14/2023    Lab Results  Component Value Date   CREATININE 0.84 02/14/2023   BUN 5 (L) 02/14/2023   NA 137 02/14/2023   K 3.3 (L) 02/14/2023   CL 104 02/14/2023   CO2 24 02/14/2023    Lab Results  Component Value Date   ALT 43 02/14/2023   AST 100 (H) 02/14/2023   ALKPHOS 81 02/14/2023     Microbiology: No results found for this or any previous visit (from the past 240 hour(s)).  Gardiner Barefoot, MD [email or web address] 02/14/2023, 1:59 PM

## 2023-02-14 NOTE — Evaluation (Signed)
Physical Therapy Evaluation Patient Details Name: John Krueger MRN: [id number] DOB: February 17, 1991 Today's Date: 02/14/2023  History of Present Illness  Pt is 32 yo male admitted on 02/13/23 with daily recurrent seizures, vision loss, memory loss, and skin lesions. Work up is ongoing (ophthalmology, neurology, and ID consulted).  MRI was negative for acute changes.  Pt with hx including seizure disorder and ETOH use  Clinical Impression  Pt admitted with above diagnosis. At baseline, pt is independent.  He has been limited with driving and working due to frequent seizures and pain.  Today, pt requiring min guard for safety. He demonstrated mild deficits in balance with standing and walking with a DGI of 16 that could indicate fall risk.  Pt feels mildly unsteady but states near baseline.  Pt expected to progress well with therapy. Pt currently with functional limitations due to the deficits listed below (see PT Problem List). Pt will benefit from acute skilled PT to increase their independence and safety with mobility to allow discharge.  Could benefit from further therapy at d/c for balance training and pain control.        Recommendations for follow up therapy are one component of a multi-disciplinary discharge planning process, led by the attending physician.  Recommendations may be updated based on patient status, additional functional criteria and insurance authorization.  Follow Up Recommendations       Assistance Recommended at Discharge PRN  Patient can return home with the following  Assistance with cooking/housework    Equipment Recommendations None recommended by PT  Recommendations for Other Services       Functional Status Assessment Patient has had a recent decline in their functional status and demonstrates the ability to make significant improvements in function in a reasonable and predictable amount of time. (mild decline)     Precautions / Restrictions  Precautions Precautions: Fall;Other (comment) Precaution Comments: seizures      Mobility  Bed Mobility Overal bed mobility: Independent                  Transfers Overall transfer level: Needs assistance Equipment used: None Transfers: Sit to/from Stand Sit to Stand: Min guard           General transfer comment: min guard A for safety    Ambulation/Gait Ambulation/Gait assistance: Min guard Gait Distance (Feet): 300 Feet Assistive device: None Gait Pattern/deviations: Step-through pattern Gait velocity: normal     General Gait Details: occasionaly scissor  pattern with drifting R/L but able to correct on his own  Stairs Stairs: Yes Stairs assistance: Min guard Stair Management: Two rails, Alternating pattern, Forwards Number of Stairs: 3    Wheelchair Mobility    Modified Rankin (Stroke Patients Only)       Balance Overall balance assessment: Needs assistance   Sitting balance-Leahy Scale: Normal     Standing balance support: No upper extremity supported Standing balance-Leahy Scale: Good                   Standardized Balance Assessment Standardized Balance Assessment : Dynamic Gait Index   Dynamic Gait Index Level Surface: Mild Impairment Change in Gait Speed: Mild Impairment Gait with Horizontal Head Turns: Mild Impairment Gait with Vertical Head Turns: Mild Impairment Gait and Pivot Turn: Mild Impairment Step Over Obstacle: Mild Impairment Step Around Obstacles: Mild Impairment Steps: Mild Impairment Total Score: 16 Deficits on DGI often related to slowed speed to complete task but no LOB        Pertinent Vitals/Pain  Pain Assessment Pain Assessment: 0-10 Pain Score: 7  Pain Location: headache/teeth Pain Descriptors / Indicators: Aching, Throbbing Pain Intervention(s): Limited activity within patient's tolerance, Monitored during session    Home Living Family/patient expects to be discharged to:: Private  residence Living Arrangements: Parent;Other relatives Available Help at Discharge: Family;Available PRN/intermittently Type of Home: House Home Access: Stairs to enter Entrance Stairs-Rails: Doctor, general practice of Steps: 3-4   Home Layout: One level Home Equipment: None      Prior Function Prior Level of Function : Independent/Modified Independent             Mobility Comments: pt Ind, uses no ADL for mobility; Reports difficulty with maintaining job and driving due to seizures ADLs Comments: Ind with ADLs, IADLs, cooking     Hand Dominance   Dominant Hand: Right    Extremity/Trunk Assessment   Upper Extremity Assessment Upper Extremity Assessment: Overall WFL for tasks assessed    Lower Extremity Assessment Lower Extremity Assessment: Overall WFL for tasks assessed RLE Deficits / Details: ROM WFL; MMT 5/5; coordination heel/shin WNL LLE Deficits / Details: ROM WFL; MMT 5/5; coordination heel/shin WNL    Cervical / Trunk Assessment Cervical / Trunk Assessment: Other exceptions Cervical / Trunk Exceptions: Forward head with limited cervical rotation  Communication   Communication: No difficulties  Cognition Arousal/Alertness: Awake/alert Behavior During Therapy: WFL for tasks assessed/performed Overall Cognitive Status: Within Functional Limits for tasks assessed                                          General Comments General comments (skin integrity, edema, etc.):   Pt reports at times has burning pains in neck, low back, R leg, and finger tips. States present for ~4 years at times.  States meds help some but wear off quickly.  Has not done therapy in past for pain. Pain is not constant, he is not hurting now.  Describes as severe burning and he will take shirt or pants off and get somewhare cool to alleviate.  Pt describes as shooting pain but that it typically shoots up from R leg. Also reports pain can cause cramping and he can  get stuck in a position.  Pt is somewhat unclear historian in regards to his pain timeline, aggravating/relieving factors, history. Did not pt with significantly forward head and limited cervical rotation.  Educated on posture and chin tucks.      Exercises     Assessment/Plan    PT Assessment Patient needs continued PT services  PT Problem List Pain;Decreased range of motion;Decreased activity tolerance;Decreased balance;Decreased mobility       PT Treatment Interventions DME instruction;Therapeutic exercise;Gait training;Stair training;Functional mobility training;Therapeutic activities;Patient/family education;Neuromuscular re-education;Modalities;Balance training    PT Goals (Current goals can be found in the Care Plan section)  Acute Rehab PT Goals Patient Stated Goal: return home PT Goal Formulation: With patient Time For Goal Achievement: 02/28/23 Potential to Achieve Goals: Good Additional Goals Additional Goal #1: Pt will score >19 on DGI for low fall risk    Frequency Min 3X/week     Co-evaluation               AM-PAC PT "6 Clicks" Mobility  Outcome Measure Help needed turning from your back to your side while in a flat bed without using bedrails?: None Help needed moving from lying on your back to sitting on the side  of a flat bed without using bedrails?: None Help needed moving to and from a bed to a chair (including a wheelchair)?: A Little Help needed standing up from a chair using your arms (e.g., wheelchair or bedside chair)?: A Little Help needed to walk in hospital room?: A Little Help needed climbing 3-5 steps with a railing? : A Little 6 Click Score: 20    End of Session Equipment Utilized During Treatment: Gait belt Activity Tolerance: Patient tolerated treatment well Patient left: in bed;with call bell/phone within reach;with bed alarm set;Other (comment) (rails up seizure prec) Nurse Communication: Mobility status PT Visit Diagnosis: Other  abnormalities of gait and mobility (R26.89);Pain Pain - part of body:  (neck, low back , R leg)    Time: 1539-1600 PT Time Calculation (min) (ACUTE ONLY): 21 min   Charges:   PT Evaluation $PT Eval Low Complexity: 1 Low          Renu Asby, PT Acute Rehab [facility] Rehab [phone]   Rayetta Humphrey 02/14/2023, 4:53 PM

## 2023-02-14 NOTE — Plan of Care (Signed)
While reviewing this chart for my note and following up on some results, I came onto a note from 2:04 AM put in by the RN Harlin Rain, where it is mentioned that I was called about some telemetry order and I asked to remove that order.  I was never contacted for that order.  I am not the primary provider in the patient's care and I am consulting and only saw the patient once at around 8:30 PM on 02/13/2023.  I have instructed the RN to edit her note to ensure that accurate information is reflected and any inaccuracies be removed. Telemetry orders are usually directed by the primary team, and I would defer this to them but with patient with concern for stroke, I would always recommend keeping them on telemetry.  -- Milon Dikes, MD Neurologist [facility] Pager: [phone]

## 2023-02-14 NOTE — Plan of Care (Signed)
  Problem: Education: Goal: Knowledge of General Education information will improve Description: Including pain rating scale, medication(s)/side effects and non-pharmacologic comfort measures Outcome: Progressing   Problem: Clinical Measurements: Goal: Respiratory complications will improve Outcome: Progressing Goal: Cardiovascular complication will be avoided Outcome: Progressing   Problem: Activity: Goal: Risk for activity intolerance will decrease Outcome: Progressing   Problem: Nutrition: Goal: Adequate nutrition will be maintained Outcome: Progressing   Problem: Coping: Goal: Level of anxiety will decrease Outcome: Progressing   Problem: Pain Managment: Goal: General experience of comfort will improve Outcome: Progressing   Problem: Skin Integrity: Goal: Risk for impaired skin integrity will decrease Outcome: Progressing   

## 2023-02-14 NOTE — Assessment & Plan Note (Signed)
Lesions of the palms/soles with some lesions particularly c/f Janeway lesions. Neg STI workup including RPR. No fever or leukocytosis, but does have some arthralgias and with refugee status, possible hx of rheumatic fever/heart disease. - f/u echo - consider skin biopsy out patient

## 2023-02-15 DIAGNOSIS — D649 Anemia, unspecified: Secondary | ICD-10-CM | POA: Diagnosis not present

## 2023-02-15 DIAGNOSIS — G40909 Epilepsy, unspecified, not intractable, without status epilepticus: Secondary | ICD-10-CM | POA: Diagnosis not present

## 2023-02-15 DIAGNOSIS — R569 Unspecified convulsions: Principal | ICD-10-CM

## 2023-02-15 DIAGNOSIS — R21 Rash and other nonspecific skin eruption: Secondary | ICD-10-CM

## 2023-02-15 LAB — HEPATITIS PANEL, ACUTE
HCV Ab: NONREACTIVE
Hep A IgM: NONREACTIVE
Hep B C IgM: NONREACTIVE
Hepatitis B Surface Ag: NONREACTIVE

## 2023-02-15 LAB — COMPREHENSIVE METABOLIC PANEL
ALT: 37 U/L (ref 0–44)
AST: 69 U/L — ABNORMAL HIGH (ref 15–41)
Albumin: 3.8 g/dL (ref 3.5–5.0)
Alkaline Phosphatase: 81 U/L (ref 38–126)
Anion gap: 10 (ref 5–15)
BUN: 7 mg/dL (ref 6–20)
CO2: 24 mmol/L (ref 22–32)
Calcium: 9.2 mg/dL (ref 8.9–10.3)
Chloride: 101 mmol/L (ref 98–111)
Creatinine, Ser: 0.81 mg/dL (ref 0.61–1.24)
GFR, Estimated: >60 mL/min (ref >60)
Glucose, Bld: 78 mg/dL (ref 70–99)
Potassium: 3.5 mmol/L (ref 3.5–5.1)
Sodium: 135 mmol/L (ref 135–145)
Total Bilirubin: 1.1 mg/dL (ref 0.3–1.2)
Total Protein: 6.9 g/dL (ref 6.5–8.1)

## 2023-02-15 LAB — CBC
HCT: 36.7 % — ABNORMAL LOW (ref 39.0–52.0)
Hemoglobin: 12.9 g/dL — ABNORMAL LOW (ref 13.0–17.0)
MCH: 32.4 pg (ref 26.0–34.0)
MCHC: 35.1 g/dL (ref 30.0–36.0)
MCV: 92.2 fL (ref 80.0–100.0)
Platelets: 108 10*3/uL — ABNORMAL LOW (ref 150–400)
RBC: 3.98 MIL/uL — ABNORMAL LOW (ref 4.22–5.81)
RDW: 15.6 % — ABNORMAL HIGH (ref 11.5–15.5)
WBC: 4.5 10*3/uL (ref 4.0–10.5)
nRBC: 0 % (ref 0.0–0.2)

## 2023-02-15 LAB — OCCULT BLOOD X 1 CARD TO LAB, STOOL: Fecal Occult Bld: NEGATIVE

## 2023-02-15 LAB — VITAMIN D 25 HYDROXY (VIT D DEFICIENCY, FRACTURES): Vit D, 25-Hydroxy: 7.92 ng/mL — ABNORMAL LOW (ref 30–100)

## 2023-02-15 LAB — HEPATITIS B SURFACE ANTIBODY,QUALITATIVE: Hep B S Ab: NONREACTIVE

## 2023-02-15 LAB — CULTURE, BLOOD (ROUTINE X 2): Culture: NO GROWTH

## 2023-02-15 MED ORDER — POLYETHYLENE GLYCOL 3350 17 G PO PACK
17.0000 g | PACK | Freq: Every day | ORAL | Status: DC
  Administered 2023-02-15 – 2023-02-16 (×2): 17 g via ORAL
  Filled 2023-02-15 (×2): qty 1

## 2023-02-15 MED ORDER — VITAMIN D (ERGOCALCIFEROL) 1.25 MG (50000 UNIT) PO CAPS
50000.0000 [IU] | ORAL_CAPSULE | ORAL | Status: DC
  Administered 2023-02-15: 50000 [IU] via ORAL
  Filled 2023-02-15: qty 1

## 2023-02-15 NOTE — Progress Notes (Signed)
ECHO pending 

## 2023-02-15 NOTE — Hospital Course (Signed)
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

## 2023-02-15 NOTE — Progress Notes (Addendum)
Daily Progress Note Intern Pager: [phone]  Patient name: John Krueger Medical record number: [id number] Date of birth: [birth year] Age: 32 y.o. Gender: male  Primary Care Provider: Evette Georges, MD Consultants: Neurology, ID Code Status: Full  Pt Overview and Major Events to Date:  4/19-admitted  Assessment and Plan: John Krueger is a 32 year old male presenting with sudden onset seizure-like activity and vision changes.  Workup is ongoing and differential remains broad including nutritional deficiency, neurocutaneous disorder, vasculitis, infection (particular c/f filarial dz), and substance abuse d/o.  Pertinent PMH/PSH includes refugee status (from [place] since 2010).   * Seizure disorder Remains undifferentiated. Suspicion for nutrient deficiency vs indolent infection vs autoimmune process such as a vasculitis (though seeming less likely with nl MRI yesterday.  EEG showed no concern for seizure activity. Neuro raised concern for filarial dz given concomitant seizures, cerebral atrophy and vision changes.  ID was consulted, saw patient yesterday and thinks filarial/onchocercosis would be unlikely given presentation and the fact he has been in the [place] for over 15 years which is over the life expectancy of the organisms. Dr. Sherrine Maples, ophthalmologist saw patient yesterday with no findings suggestive of filarial disease but did recommend outpatient follow-up due to corneal scarring OS, ocular hypertension, and monocular diplopia OU.  Anemia panel ruled out iron deficiency anemia. -Neurology and ID on board, appreciate recommendations -May need LP if workup remains unrevealing -Continue Keppra 1000 mg twice daily -B12, folate, TSH all WNL, B1 pending. -f/u B6, Vit 6, Zinc -B3 deficiency is a possible explanation for neurodegenerative changes and acral dermatitis, but his dermatitis is atypical. Similarly,   B2 would account for perioral changes and anemia. Unfortunately these are both  send-out labs. Labs sent and will empirically start B complex vitamin.  -Seizure precautions -Vitals per unit routine   Other skin changes Lesions of the palms/soles with some lesions particularly c/f Janeway lesions. Neg STI workup including RPR. No fever or leukocytosis, but does have some arthralgias and with refugee status, possible hx of rheumatic fever/heart disease. - f/u echo - consider skin biopsy out patient   GI bleeding Patient reported GI bleeding during recent office visit.  None noted while here.  Differential includes hemorrhoids.  Peutz-Jeghers syndrome less likely though on differential given characteristic lip findings on exam. -f/u FOBT, consider GI consult if positive  Alcohol use Patient reports 2 cans of beer on the weekends, though his sister reports much more frequent use.  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 RUQ [place] with moderate hepatic steatosis, almost certainly from his alcohol use.  -CIWA protocol : have been 0 -TOC consult for substance abuse  Chronic pain Most likely neuropathic in the setting of neurological dysfunction. -Continue home Robaxin 1000 mg twice daily and naproxen 500 twice daily -Consulted neurology for further recommendations  Tobacco use Unknown amount of smoking for unknown amount of time.  He is interested in obtaining nicotine patch here in the hospital. -Nicotine patch 14 mg daily -TOC consult   FEN/GI: Regular PPx: Lovenox Dispo: Home pending continued medical management  Subjective:  Patient states he is feeling well this morning.  Has not had any seizure activity since admission.  Has been tolerating medications well.  Objective: Temp:  [98.2 F (36.8 C)-99.1 F (37.3 C)] 99.1 F (37.3 C) (04/21 1228) Pulse Rate:  [64-74] 74 (04/21 1228) Resp:  [16-18] 16 (04/21 1228) BP: (121-138)/(86-95) 121/87 (04/21 1228) SpO2:  [98 %-100 %] 98 % (04/21 1228)  Physical Exam: General:  32 year old male sitting up in bed, NAD Cardiovascular: RRR Respiratory: CTAB, normal work of breathing Abdomen: Soft, nontender, nondistended Neuro: Alert, no focal deficits  Laboratory: Most recent CBC Lab Results  Component Value Date   WBC 4.5 02/15/2023   HGB 12.9 (L) 02/15/2023   HCT 36.7 (L) 02/15/2023   MCV 92.2 02/15/2023   PLT 108 (L) 02/15/2023   Most recent BMP    Latest Ref Rng & Units 02/15/2023    3:09 AM  BMP  Glucose 70 - 99 mg/dL 78   BUN 6 - 20 mg/dL 7   Creatinine 1.61 - 0.96 mg/dL 0.45   Sodium 409 - 811 mmol/L 135   Potassium 3.5 - 5.1 mmol/L 3.5   Chloride 98 - 111 mmol/L 101   CO2 22 - 32 mmol/L 24   Calcium 8.9 - 10.3 mg/dL 9.2     Erick Alley, DO 02/15/2023, 3:46 PM  PGY-2, [facility] FPTS Intern pager: [phone], text pages welcome Secure chat group [facility] Teaching Service

## 2023-02-15 NOTE — Plan of Care (Signed)
  Problem: Education: Goal: Knowledge of General Education information will improve Description: Including pain rating scale, medication(s)/side effects and non-pharmacologic comfort measures Outcome: Progressing   Problem: Activity: Goal: Risk for activity intolerance will decrease Outcome: Progressing   Problem: Nutrition: Goal: Adequate nutrition will be maintained Outcome: Progressing   

## 2023-02-15 NOTE — Progress Notes (Addendum)
Neurology Progress Note  Brief HPI: 32 yo male with seizure disorder and chronic alcohol use presents with seizures, photophobia, and skin lesions.  + tobacco abuse.  Subjective: Patient seen in room as he is resting in bed.  His TV is on.  He tells me he doesn't see the TV well as the picture seems to wiggle, but he listens to it. He continues to have photophobia.  He squints to look up or toward light and notes it causes his eyes to water and headaches.  He endorses he has been having subjective fevers multiple times per week for a while.  He feels cold and shakes and takes Tylenol.  He says that he sometimes throws up during these times.  He then starts to sweat and feels better.  He tells me he has neck stiffness.  He goes on to describe that he often has body pains and that it feels better to sleep in the floor than it does to sleep in his bed.  He says that sometimes after these fevers his appetite is poor and the smell of his cigarettes even bothers him.  He also complains about his feet.  He tries to wear regular shoes and his feet start to itch and then it bothers his whole legs.  He has been wearing sandles instead of regular shoes because of this.  Exam: Vitals:   02/14/23 1946 02/15/23 0828  BP: (!) 138/95 128/86  Pulse: 64 70  Resp: 18   Temp: 98.9 F (37.2 C) 98.2 F (36.8 C)  SpO2: 100% 100%   Gen: In bed, NAD Resp: non-labored breathing, no acute distress Abd: soft, nt Skin: Lesions noted.   Neuro: Mental Status: AAox4. Speech is clear. Patient is able to give a clear and coherent history. No signs of aphasia or neglect Cranial Nerves: II: Visual Fields are full. PERRL.   III,IV, VI: EOMI without ptosis or diploplia.  V: Facial sensation is symmetric to touch VII: Facial movement is symmetric resting and smiling VIII: Hearing is intact to voice X: Palate elevates symmetrically XI: Shoulder shrug is symmetric. XII: Tongue protrudes midline  Motor: Tone is normal.  Bulk is normal. 5/5 strength BUE flexion/ extension.  4+/5 BLE distal flexion/ extension 5/5 more proximal.   Sensory: Sensation is symmetric to light touch in the arms and legs. No extinction to DSS present.  Cerebellar: Finger to nose intact without ataxia B/L. Gait: not observed    Pertinent Labs: CBC    Component Value Date/Time   WBC 4.5 02/15/2023 0309   RBC 3.98 (L) 02/15/2023 0309   HGB 12.9 (L) 02/15/2023 0309   HGB 14.4 01/30/2023 1622   HCT 36.7 (L) 02/15/2023 0309   HCT 42.2 01/30/2023 1622   PLT 108 (L) 02/15/2023 0309   PLT 171 01/30/2023 1622   MCV 92.2 02/15/2023 0309   MCV 92 01/30/2023 1622   MCH 32.4 02/15/2023 0309   MCHC 35.1 02/15/2023 0309   RDW 15.6 (H) 02/15/2023 0309   RDW 15.2 01/30/2023 1622   LYMPHSABS 2.5 01/30/2023 1622   MONOABS 0.8 11/06/2022 2228   EOSABS 0.0 01/30/2023 1622   BASOSABS 0.0 01/30/2023 1622   BMET    Component Value Date/Time   NA 135 02/15/2023 0309   NA 140 01/30/2023 1622   K 3.5 02/15/2023 0309   CL 101 02/15/2023 0309   CO2 24 02/15/2023 0309   GLUCOSE 78 02/15/2023 0309   BUN 7 02/15/2023 0309   BUN 4 (L) 01/30/2023  1622   CREATININE 0.81 02/15/2023 0309   CALCIUM 9.2 02/15/2023 0309   EGFR 120 01/30/2023 1622   GFRNONAA >60 02/15/2023 0309     Imaging Reviewed: MRI brain 02/14/23- No acute abnormality. Static trend of preexisting generalized atrophy when compared to 2021 study.  EEG 02/14/23 Normal  Assessment: 32 yo with multiple systemic complaints and seizure disorder. He was not consistently on Keppra due to socioeconomic factors. However, notes he has medicare/medicaid coverage now and that this should not be a problem.  - Safety: He stays with his girlfriend when she is home and then when she leaves to go to work he stays with his mom so he is never alone due to the frequent seizures. - Medication availability: When he needs medications or other items he can ask others to drive his car to get what  he needs. - He understands his seizure risks and does not drive or operate heavy equipment, which is why he is currently not able to work. 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   Recommendations: - Given that he has had no fevers here will not recommend LP at this time. - Would recommend chronic malaria testing as he comes from an area that is endemic, has headaches, vision problems, anemia and intermittent rigors. - Continue Keppra 1000 mg BID - F/u outpatient neurology. - Continue seizure precautions - ID is following for the concerns for pruritus and skin hyperpigmentation.  Considering skin biopsy. Hepatitis serology pending. Nutritional workup is benign so far, but Vit D, B6, C, and Zinc pending. - Recommend cessation of alcohol use in conjunction with seizure disorder. - Eye exam has been completed inpatient for his vision loss and photophobia complaints with no acute concerns and outpatient f/u recommended. (See ophthalmology note from 02/12/23) - May need Case Manager to consult for assistance in finding a low cost primary medical provider and prescription assistance - Neurohospitalist service with follow PRN. Please call us if ID work up reveals a diagnosis that is associated with CNS, PNS or muscle pathology.    Patient seen and examined by NP/APP with MD.  Leanord Hawking, AGACNP-BC [facility]  Electronically signed: Dr. Caryl Pina

## 2023-02-16 ENCOUNTER — Observation Stay (HOSPITAL_BASED_OUTPATIENT_CLINIC_OR_DEPARTMENT_OTHER): Payer: Medicaid Other

## 2023-02-16 DIAGNOSIS — G40909 Epilepsy, unspecified, not intractable, without status epilepticus: Secondary | ICD-10-CM | POA: Diagnosis not present

## 2023-02-16 DIAGNOSIS — I38 Endocarditis, valve unspecified: Secondary | ICD-10-CM | POA: Diagnosis not present

## 2023-02-16 DIAGNOSIS — R569 Unspecified convulsions: Secondary | ICD-10-CM | POA: Diagnosis not present

## 2023-02-16 LAB — COMPREHENSIVE METABOLIC PANEL
ALT: 33 U/L (ref 0–44)
AST: 58 U/L — ABNORMAL HIGH (ref 15–41)
Albumin: 3.5 g/dL (ref 3.5–5.0)
Alkaline Phosphatase: 75 U/L (ref 38–126)
Anion gap: 13 (ref 5–15)
BUN: 11 mg/dL (ref 6–20)
CO2: 21 mmol/L — ABNORMAL LOW (ref 22–32)
Calcium: 9 mg/dL (ref 8.9–10.3)
Chloride: 101 mmol/L (ref 98–111)
Creatinine, Ser: 0.83 mg/dL (ref 0.61–1.24)
GFR, Estimated: >60 mL/min (ref >60)
Glucose, Bld: 78 mg/dL (ref 70–99)
Potassium: 3.9 mmol/L (ref 3.5–5.1)
Sodium: 135 mmol/L (ref 135–145)
Total Bilirubin: 0.8 mg/dL (ref 0.3–1.2)
Total Protein: 6.6 g/dL (ref 6.5–8.1)

## 2023-02-16 LAB — CBC WITH DIFFERENTIAL/PLATELET
Abs Immature Granulocytes: 0.01 10*3/uL (ref 0.00–0.07)
Basophils Absolute: 0 10*3/uL (ref 0.0–0.1)
Basophils Relative: 1 %
Eosinophils Absolute: 0.1 10*3/uL (ref 0.0–0.5)
Eosinophils Relative: 2 %
HCT: 37.4 % — ABNORMAL LOW (ref 39.0–52.0)
Hemoglobin: 12.8 g/dL — ABNORMAL LOW (ref 13.0–17.0)
Immature Granulocytes: 0 %
Lymphocytes Relative: 40 %
Lymphs Abs: 1.6 10*3/uL (ref 0.7–4.0)
MCH: 31.9 pg (ref 26.0–34.0)
MCHC: 34.2 g/dL (ref 30.0–36.0)
MCV: 93.3 fL (ref 80.0–100.0)
Monocytes Absolute: 0.5 10*3/uL (ref 0.1–1.0)
Monocytes Relative: 11 %
Neutro Abs: 1.8 10*3/uL (ref 1.7–7.7)
Neutrophils Relative %: 46 %
Platelets: 89 10*3/uL — ABNORMAL LOW (ref 150–400)
RBC: 4.01 MIL/uL — ABNORMAL LOW (ref 4.22–5.81)
RDW: 15 % (ref 11.5–15.5)
WBC: 4 10*3/uL (ref 4.0–10.5)
nRBC: 0 % (ref 0.0–0.2)

## 2023-02-16 LAB — ECHOCARDIOGRAM COMPLETE
AR max vel: 3.21 cm2
AV Area VTI: 3.12 cm2
AV Area mean vel: 2.87 cm2
AV Mean grad: 3 mmHg
AV Peak grad: 4.7 mmHg
Ao pk vel: 1.08 m/s
Area-P 1/2: 4.42 cm2
Height: 66 in
S' Lateral: 3.4 cm
Weight: 2857.16 oz

## 2023-02-16 LAB — CULTURE, BLOOD (ROUTINE X 2)

## 2023-02-16 MED ORDER — METHOCARBAMOL 500 MG PO TABS: 1000.0000 mg | ORAL_TABLET | Freq: Two times a day (BID) | ORAL | 0 refills | Status: DC

## 2023-02-16 MED ORDER — ADULT MULTIVITAMIN W/MINERALS CH: 1.0000 | ORAL_TABLET | Freq: Every day | ORAL | 0 refills | Status: DC

## 2023-02-16 MED ORDER — VITAMIN D (ERGOCALCIFEROL) 1.25 MG (50000 UNIT) PO CAPS: 50000.0000 [IU] | ORAL_CAPSULE | ORAL | 0 refills | Status: DC

## 2023-02-16 MED ORDER — B COMPLEX-C PO TABS: 1.0000 | ORAL_TABLET | Freq: Every day | ORAL | 0 refills | Status: AC

## 2023-02-16 MED ORDER — VITAMIN B-1 100 MG PO TABS: 100.0000 mg | ORAL_TABLET | Freq: Every day | ORAL | 0 refills | Status: DC

## 2023-02-16 MED ORDER — LEVETIRACETAM 1000 MG PO TABS: 1000.0000 mg | ORAL_TABLET | Freq: Two times a day (BID) | ORAL | 0 refills | Status: DC

## 2023-02-16 MED ORDER — FOLIC ACID 1 MG PO TABS: 1.0000 mg | ORAL_TABLET | Freq: Every day | ORAL | 0 refills | Status: DC

## 2023-02-16 NOTE — Discharge Instructions (Addendum)
Thank you for letting us care for you during your stay.  You were admitted to the [facility] Medicine Teaching Service.   You were admitted for seizures.  We found the following during your stay your seizures were controlled with Keppra, the dose is increased and there were no seizures while you are here.  We recommend follow up specifically: 1.  Your PCP for management of your blood pressure 2.  Follow-up with the ophthalmologist for your changes in vision 3. Alcohol cessation resources provided in the hospital  Please take your medications as instructed below: 1.  Multivitamins, take 1 tablet a day 2.  Thiamine, also known as vitamin B1, take 1 tablet daily 3.  Vitamin D, Drisdol, take 1 capsule every 7 days 4.  Folvite, folic acid, take 1 tablet daily 5.  The dose of your Keppra has changed, you will take 1000 mg by mouth twice a day, for 2000 mg total daily. 6.  You can continue taking your home naproxen twice a day as needed for pain  It is very important to abstain from use of alcohol so that your medications can work properly and prevent seizures.   Please follow up with your primary care physician in 1 week.   If your symptoms worsen or return, please return to the hospital.  Please let us know if you have questions about your stay at [facility].

## 2023-02-16 NOTE — Discharge Summary (Addendum)
Family Medicine Teaching [facility] Discharge Summary  Patient name: John [facility] Medical record number: [id number] Date of birth: [birth year] Age: 32 y.o. Gender: male Date of Admission: 02/13/2023  Date of Discharge: 02/16/23  Admitting Physician: Evette Georges, MD  Primary Care Provider: Evette Georges, MD Consultants: Neurology, Infectious Disease  Indication for Hospitalization: Refractory seizures  Discharge Diagnoses/Problem List:  Principal Problem for Admission:  Other Problems addressed during stay:  Principal Problem:   Seizure disorder Active Problems:   Chronic pain   GI bleeding   Alcohol use   Tobacco use   Other skin changes   Seizure-like activity   Rash and nonspecific skin eruption   Anemia    Brief [facility] Course:  John Kr[facility]ger is a 32 y.o. male who was admitted to the Family Medicine Teaching Service at [facility] for refractory seizure-like activity in the setting of social barriers to care. [facility] course is outlined below by problem.   Seizure-like activity Patient presented with years long history of seizure-like activity (see H&P for full details) in the setting of social determinants of health precluding him from timely follow-up.  Neurology was consulted who increased Keppra to 1000 mg twice daily, obtain spot EEG which was normal, and obtained MRI brain which showed generalized cerebral atrophy (consistent with prior CT head in the ED).  Thought presentation could be due to onchocerciasis.  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  ID was consulted given macular rash with SLA recommended skin biopsy and TTE.  TTE demonstrated no evidence of endocarditis and was significant only for a prominent chiari network in the R atrium. All vitamin labs, other than low vitamin D, were within normal limits.  Unfortunately, vit B1--thought one of the most likely culprits--was unable to be collected in the [facility]. By discharge, patient was without seizure activity for the entirety of his stay.   GI bleeding Patient reported GI bleeding in the outpatient setting appears to be chronic.  CBC was stable on admission with hemoglobin 12.7 and MCV 91.2.  Platelets were also low at 143.  FOBT over admission negative.  No further episodes of hematochezia were reported over admission.  Consider hemorrhoids as a cause, though Peutz-Jeghers syndrome on differential given lip findings in H&P.  Alcohol and tobacco use Patient reported 2 cans of beer on the weekends on admission, otherwise sister reported much more frequent use.  Elevated LFTs with AST greater than ALT suggestive of alcoholic hepatitis.  Right upper quadrant ultrasound with hepatic steatosis with no cirrhosis.  CIWA protocol was initiated, all scores were 0.  Nicotine patch 14 mg daily was ordered.  TOC was consulted for cessation resources.  By discharge, LFTs were downtrending, AST 58, ALT 33, ALP 75.  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 Was seizure free on keppra in the [facility]. Suspect medication non-adherence plays a large role in his frequent seizures. Follow up Vit B2, B2, C, and Zinc 4.   Needs Vitamin B1 collected. Is already receiving empiric thiamine supplementation which we would recommend continuing in light of his ongoing etOH use.   Disposition: Home  Discharge Condition: Stable  Discharge Exam:  Vitals:   02/16/23 1138 02/16/23 1624  BP: 131/86 129/85  Pulse: 73 60  Resp: 16 16  Temp: 99.1 F (37.3 C) 98.4 F (36.9 C)  SpO2:  99%    General: Alert, pleasant and cooperative CV: RRR, no M/R/G, normal S1/S2 Pulm: No work of breathing on room air, CTAB, no rhonchi crackles or wheezing GI: Nondistended, nontender, normoactive bowel sounds Skin: Hyperpigmented macular lesion on palms and soles; hypopigmented lower lip  Significant Procedures: None  Significant Labs and Imaging:  Recent Labs  Lab 02/15/23 0309 02/16/23 0447  WBC 4.5 4.0  HGB 12.9* 12.8*  HCT 36.7* 37.4*  PLT 108* 89*   Recent Labs  Lab 02/15/23 0309 02/16/23 0447  NA 135 135  K 3.5 3.9  CL 101 101  CO2 24 21*  GLUCOSE 78 78  BUN 7 11  CREATININE 0.81 0.83  CALCIUM 9.2 9.0  ALKPHOS 81 75  AST 69* 58*  ALT 37 33  ALBUMIN 3.8 3.5    ECHOCARDIOGRAM COMPLETE  Result Date: 02/16/2023    ECHOCARDIOGRAM REPORT   Patient Name:   John Krueger Date of Exam: 02/16/2023 Medical Rec #:  [id number]  Height:       66.0 in Accession #:    [id number] Weight:       178.6 lb Date of Birth:  1991/09/22  BSA:          1.906 m Patient Age:    31 years   BP:           130/90 mmHg Patient Gender: M          HR:           64 bpm. Exam Location:  Inpatient Procedure: 2D Echo, Cardiac Doppler and Color Doppler Indications:    Endocarditis  History:        Patient has no prior history of Echocardiogram examinations.                 Risk Factors:Non-Smoker.  Sonographer:    Dondra Prader RVT Referring Phys: [id number] Theador Hawthorne ENIOLA IMPRESSIONS  1. Left ventricular ejection fraction, by estimation, is 55 to 60%. The left ventricle has normal function. The left ventricle has no regional wall motion abnormalities. Left ventricular diastolic parameters were normal.  2. Right ventricular systolic function is normal. The right ventricular size is normal. There is normal pulmonary artery systolic pressure. The estimated right ventricular systolic pressure is 32.2 mmHg.  3. There appears to be a prominent Chiari network in the right atrium.  4. The mitral valve is normal in structure. No  evidence of mitral valve regurgitation. No evidence of mitral stenosis.  5. The aortic valve is tricuspid. Aortic valve regurgitation is not visualized. No aortic stenosis is present.  6. Aortic dilatation noted. There is mild dilatation of the aortic root, measuring 38 mm.  7. The inferior vena cava is normal in size with greater than 50% respiratory variability, suggesting right atrial pressure of 3 mmHg.  8. No evidence for endocarditis. FINDINGS  Left Ventricle: Left ventricular ejection fraction, by estimation, is 55 to 60%. The left ventricle has normal function. The left ventricle has no regional wall motion abnormalities. The left ventricular internal cavity size was normal in size. There is  no left ventricular hypertrophy. Left ventricular diastolic parameters were normal. Right Ventricle: The right ventricular size is normal. No increase in right ventricular wall thickness. Right ventricular systolic function is normal. There is normal pulmonary artery systolic pressure. The  tricuspid regurgitant velocity is 2.70 m/s, and  with an assumed right atrial pressure of 3 mmHg, the estimated right ventricular systolic pressure is 32.2 mmHg. Left Atrium: Left atrial size was normal in size. Right Atrium: There appears to be a prominent Chiari network in the right atrium. Right atrial size was normal in size. Pericardium: There is no evidence of pericardial effusion. Mitral Valve: The mitral valve is normal in structure. No evidence of mitral valve regurgitation. No evidence of mitral valve stenosis. Tricuspid Valve: The tricuspid valve is normal in structure. Tricuspid valve regurgitation is trivial. Aortic Valve: The aortic valve is tricuspid. Aortic valve regurgitation is not visualized. No aortic stenosis is present. Aortic valve mean gradient measures 3.0 mmHg. Aortic valve peak gradient measures 4.7 mmHg. Aortic valve area, by VTI measures 3.12 cm. Pulmonic Valve: The pulmonic valve was normal in structure.  Pulmonic valve regurgitation is trivial. Aorta: Aortic dilatation noted. There is mild dilatation of the aortic root, measuring 38 mm. Venous: The inferior vena cava is normal in size with greater than 50% respiratory variability, suggesting right atrial pressure of 3 mmHg. IAS/Shunts: No atrial level shunt detected by color flow Doppler.  LEFT VENTRICLE PLAX 2D LVIDd:         5.10 cm   Diastology LVIDs:         3.40 cm   LV e' medial:    8.78 cm/s LV PW:         1.20 cm   LV E/e' medial:  6.9 LV IVS:        0.90 cm   LV e' lateral:   11.30 cm/s LVOT diam:     2.10 cm   LV E/e' lateral: 5.3 LV SV:         69 LV SV Index:   36 LVOT Area:     3.46 cm  RIGHT VENTRICLE             IVC RV Basal diam:  3.70 cm     IVC diam: 1.10 cm RV Mid diam:    2.60 cm RV S prime:     11.80 cm/s TAPSE (M-mode): 2.3 cm LEFT ATRIUM             Index        RIGHT ATRIUM           Index LA diam:        3.50 cm 1.84 cm/m   RA Area:     16.80 cm LA Vol (A2C):   29.1 ml 15.27 ml/m  RA Volume:   48.00 ml  25.19 ml/m LA Vol (A4C):   22.3 ml 11.70 ml/m LA Biplane Vol: 25.9 ml 13.59 ml/m  AORTIC VALVE                    PULMONIC VALVE AV Area (Vmax):    3.21 cm     PV Vmax:          0.76 m/s AV Area (Vmean):   2.87 cm     PV Peak grad:     2.3 mmHg AV Area (VTI):     3.12 cm     PR End Diast Vel: 1.53 msec AV Vmax:           108.00 cm/s AV Vmean:          76.200 cm/s AV VTI:            0.220 m AV Peak Grad:  4.7 mmHg AV Mean Grad:      3.0 mmHg LVOT Vmax:         100.00 cm/s LVOT Vmean:        63.200 cm/s LVOT VTI:          0.198 m LVOT/AV VTI ratio: 0.90  AORTA Ao Root diam: 3.65 cm Ao Asc diam:  3.10 cm Ao Arch diam: 3.0 cm MITRAL VALVE               TRICUSPID VALVE MV Area (PHT): 4.42 cm    TR Peak grad:   29.2 mmHg MV Decel Time: 172 msec    TR Vmax:        270.00 cm/s MV E velocity: 60.35 cm/s MV A velocity: 48.00 cm/s  SHUNTS MV E/A ratio:  1.26        Systemic VTI:  0.20 m                            Systemic Diam: 2.10 cm  Dalton McleanMD Electronically signed by Wilfred Lacy Signature Date/Time: 02/16/2023/4:32:43 PM    Final    US Abdomen Limited RUQ (LIVER/GB)  Result Date: 02/14/2023 CLINICAL DATA:  Right upper quadrant abdominal pain EXAM: ULTRASOUND ABDOMEN LIMITED RIGHT UPPER QUADRANT COMPARISON:  None Available. FINDINGS: Gallbladder: No gallstones or wall thickening visualized. No sonographic Murphy sign noted by sonographer. Common bile duct: Diameter: 3 mm in proximal diameter Liver: Hepatic parenchymal echogenicity is diffusely increased and there is coarsening of the hepatic echotexture in keeping with moderate hepatic steatosis. No focal intrahepatic masses are seen and there is no intrahepatic biliary ductal dilation. Portal vein is patent on color Doppler imaging with normal direction of blood flow towards the liver. Other: None. IMPRESSION: 1. No acute abnormality. 2. Moderate hepatic steatosis. Electronically Signed   By: Helyn Numbers M.D.   On: 02/14/2023 03:16   MR BRAIN W WO CONTRAST  Result Date: 02/14/2023 CLINICAL DATA:  Seizure EXAM: MRI HEAD WITHOUT AND WITH CONTRAST TECHNIQUE: Multiplanar, multiecho pulse sequences of the brain and surrounding structures were obtained without and with intravenous contrast. Seven series are provided. CONTRAST:  8mL GADAVIST GADOBUTROL 1 MMOL/ML IV SOLN COMPARISON:  None Available. FINDINGS: Brain: No acute infarct, mass effect or extra-axial collection. No acute or chronic hemorrhage. Normal white matter signal. Generalized volume loss. The midline structures are normal. Vascular: Major flow voids are preserved. Skull and upper cervical spine: Normal calvarium and skull base. Visualized upper cervical spine and soft tissues are normal. Sinuses/Orbits:Right maxillary sinus mucosal thickening. Normal orbits. IMPRESSION: 1. No acute intracranial abnormality. 2. Generalized volume loss. Electronically Signed   By: Deatra Robinson M.D.   On: 02/14/2023 02:23     Results/Tests Pending at Time of Discharge: None  Discharge Medications:  Allergies as of 02/16/2023   No Known Allergies      Medication List     STOP taking these medications    potassium chloride SA 20 MEQ tablet Commonly known as: KLOR-CON M       TAKE these medications    B-complex with vitamin C tablet Take 1 tablet by mouth daily. Start taking on: February 17, 2023   folic acid 1 MG tablet Commonly known as: FOLVITE Take 1 tablet (1 mg total) by mouth daily. Start taking on: February 17, 2023   levETIRAcetam 1000 MG tablet Commonly known as: KEPPRA Take 1 tablet (1,000 mg total) by mouth 2 (two) times daily.  What changed:  medication strength how much to take Another medication with the same name was removed. Continue taking this medication, and follow the directions you see here.   methocarbamol 500 MG tablet Commonly known as: ROBAXIN Take 2 tablets (1,000 mg total) by mouth 2 (two) times daily.   multivitamin with minerals Tabs tablet Take 1 tablet by mouth daily. Start taking on: February 17, 2023   naproxen 500 MG tablet Commonly known as: NAPROSYN Take 1 tablet (500 mg total) by mouth 2 (two) times daily.   thiamine 100 MG tablet Commonly known as: Vitamin B-1 Take 1 tablet (100 mg total) by mouth daily. Start taking on: February 17, 2023   Vitamin D (Ergocalciferol) 1.25 MG (50000 UNIT) Caps capsule Commonly known as: DRISDOL Take 1 capsule (50,000 Units total) by mouth every 7 (seven) days. Start taking on: February 22, 2023       Discharge Instructions: Please refer to Patient Instructions section of EMR for full details.  Patient was counseled important signs and symptoms that should prompt return to medical care, changes in medications, dietary instructions, activity restrictions, and follow up appointments.   Follow-Up Appointments:  Future Appointments  Date Time Provider Department Center  08/06/2023  8:30 AM Van Clines, MD [facility]  None     Lorri Frederick, MD 02/16/2023, 4:55 PM PGY-1, [facility]     I have evaluated this patient along with Dr. Theodis Aguas and reviewed the above note, making necessary revisions.  Dorothyann Gibbs, MD 02/16/2023, 5:35 PM PGY-2, [facility]

## 2023-02-16 NOTE — TOC Initial Note (Addendum)
Transition of Care [facility]) - Initial/Assessment Note    Patient Details  Name: John [facility] MRN: [id number] Date of Birth: 05-Apr-1991  [facility] of [facility]) CM/SW Contact:    Kermit Balo, RN Phone Number: 02/16/2023, 11:30 AM  Clinical Narrative:                 Pt is from home with his mom and nephews. He states his friends are also with him during the daytime when his mom is out of the home.  No DME.  His mom/ friends/ sister provide needed transportation. Pt manages his own medications and denies any issues.  Pharmacy: [facility] on [address]. CM consulted for medication assistance. Pt has medicaid and they cover his meds from $0 copay to $4. CM can not assist any further.  Pt has transportation home when medically ready.   Expected Discharge Plan: Home/Self Care Barriers to Discharge: Continued Medical Work up   Patient Goals and CMS Choice            Expected Discharge Plan and Services       Living arrangements for the past 2 months: Single Family Home                                      Prior Living Arrangements/Services Living arrangements for the past 2 months: Single Family Home Lives with:: Parents, Other (Comment) (nephews) Patient language and need for interpreter reviewed:: Yes Do you feel safe going back to the place where you live?: Yes            Criminal Activity/Legal Involvement Pertinent to Current Situation/Hospitalization: No - Comment as needed  Activities of Daily Living Home Assistive Devices/Equipment: None ADL Screening (condition at time of admission) Patient's cognitive ability adequate to safely complete daily activities?: No Is the patient deaf or have difficulty hearing?: No Does the patient have difficulty seeing, even when wearing glasses/contacts?: No Does the patient have difficulty concentrating, remembering, or making decisions?: No Patient able to express need for assistance with ADLs?: Yes Does the patient  have difficulty dressing or bathing?: No Independently performs ADLs?: Yes (appropriate for developmental age) Does the patient have difficulty walking or climbing stairs?: No Weakness of Legs: None Weakness of Arms/Hands: None  Permission Sought/Granted                  Emotional Assessment Appearance:: Appears stated age Attitude/Demeanor/Rapport: Engaged Affect (typically observed): Accepting Orientation: : Oriented to Self, Oriented to Place, Oriented to  Time, Oriented to Situation Alcohol / Substance Use: Alcohol Use Psych Involvement: No (comment)  Admission diagnosis:  Seizures [R56.9] Patient Active Problem List   Diagnosis Date Noted   Seizure-like activity 02/15/2023   Rash and nonspecific skin eruption 02/15/2023   Anemia 02/15/2023   Other skin changes 02/14/2023   GI bleeding 02/13/2023   Alcohol use 02/13/2023   Tobacco use 02/13/2023   Seizure disorder 01/15/2023   Chronic pain 01/15/2023   Dental decay 01/15/2023   PCP:  Evette Georges, MD Pharmacy:   [facility] [phone] - [place], [place] - [address] AT [facility] OF [address] & [address] [place] [address] Phone: [phone] Fax: [phone]  [facility] - [facility] [address], [address] [place] [address] Phone: [phone] Fax: [phone]  [facility] [address]  [place] [address] Phone: [phone] Fax: [phone]  [id number] - [place], [place] - [address] AT [facility] [address] [place] [address] Phone: [phone] Fax: [phone]  [id number] [place], [place] - [address] AT [facility] [address] [place] [address] Phone: [phone] Fax: [phone]     Social Determinants of Health (SDOH) Social History: SDOH Screenings   Food Insecurity: No Food  Insecurity (02/13/2023)  Housing: Low Risk  (02/13/2023)  Transportation Needs: No Transportation Needs (02/13/2023)  Utilities: Not At Risk (02/13/2023)  Depression (PHQ2-9): High Risk (02/12/2023)  Tobacco Use: High Risk (02/13/2023)   SDOH Interventions:     Readmission Risk Interventions     No data to display

## 2023-02-16 NOTE — Progress Notes (Signed)
Occupational Therapy Treatment Patient Details Name: John [facility]ger MRN: [id number] DOB: Nov 04, 1990 Today's Date: 02/16/2023   History of present illness Pt is 32 yo male admitted on 02/13/23 with daily recurrent seizures, vision loss, memory loss, and skin lesions. Work up is ongoing (ophthalmology, neurology, and ID consulted).  MRI was negative for acute changes.  Pt with hx including seizure disorder and ETOH use   OT comments  Pt making good progress with functional goals. Pt reports that he feels much better and that he is not seeing objects or people "moving" at this time, no c/o pain. Pt eager to d/c home this afternoon   Recommendations for follow up therapy are one component of a multi-disciplinary discharge planning process, led by the attending physician.  Recommendations may be updated based on patient status, additional functional criteria and insurance authorization.    Assistance Recommended at Discharge PRN  Patient can return home with the following  Assist for transportation;Assistance with cooking/housework;A little help with bathing/dressing/bathroom;A little help with walking and/or transfers   Equipment Recommendations  None recommended by OT    Recommendations for Other Services      Precautions / Restrictions Precautions Precautions: Fall;Other (comment) Precaution Comments: seizures Restrictions Weight Bearing Restrictions: No       Mobility Bed Mobility Overal bed mobility: Independent                  Transfers Overall transfer level: Needs assistance Equipment used: None Transfers: Sit to/from Stand Sit to Stand: Modified independent (Device/Increase time)                 Balance Overall balance assessment: Needs assistance   Sitting balance-Leahy Scale: Good     Standing balance support: No upper extremity supported Standing balance-Leahy Scale: Good                             ADL either performed or assessed with  clinical judgement   ADL Overall ADL's : Modified independent     Grooming: Modified independent       Lower Body Bathing: Modified independent Lower Body Bathing Details (indicate cue type and reason): simulated     Lower Body Dressing: Modified independent   Toilet Transfer: Modified Independent   Toileting- Clothing Manipulation and Hygiene: Modified independent   Tub/ Shower Transfer: Modified independent          Extremity/Trunk Assessment Upper Extremity Assessment Upper Extremity Assessment: Overall WFL for tasks assessed   Lower Extremity Assessment Lower Extremity Assessment: Defer to PT evaluation        Vision Ability to See in Adequate Light: 0 Adequate Additional Comments: pt reports that he does not see objects or people "moving" at this time, but that it happens on and off   Perception     Praxis      Cognition Arousal/Alertness: Awake/alert Behavior During Therapy: WFL for tasks assessed/performed Overall Cognitive Status: Within Functional Limits for tasks assessed                                 General Comments: Pt ssems to be in better spirits, smiling and states that he feels much better        Exercises      Shoulder Instructions       General Comments      Pertinent Vitals/ Pain  Pain Assessment Pain Assessment: No/denies pain Pain Score: 0-No pain Pain Intervention(s): Monitored during session  Home Living                                          Prior Functioning/Environment              Frequency  Min 2X/week        Progress Toward Goals  OT Goals(current goals can now be found in the care plan section)  Progress towards OT goals: Progressing toward goals     Plan Discharge plan remains appropriate    Co-evaluation                 AM-PAC OT "6 Clicks" Daily Activity     Outcome Measure   Help from another person eating meals?: None Help from another  person taking care of personal grooming?: None Help from another person toileting, which includes using toliet, bedpan, or urinal?: None Help from another person bathing (including washing, rinsing, drying)?: None Help from another person to put on and taking off regular upper body clothing?: None Help from another person to put on and taking off regular lower body clothing?: None 6 Click Score: 24    End of Session    OT Visit Diagnosis: Unsteadiness on feet (R26.81);Other abnormalities of gait and mobility (R26.89)   Activity Tolerance Patient tolerated treatment well   Patient Left in bed;with call bell/phone within reach;with bed alarm set   Nurse Communication          Time: 0981-1914 OT Time Calculation (min): 16 min  Charges: OT General Charges $OT Visit: 1 Visit OT Treatments $Self Care/Home Management : 8-22 mins    Galen Manila 02/16/2023, 12:58 PM

## 2023-02-16 NOTE — Plan of Care (Signed)
  Problem: Education: Goal: Knowledge of General Education information will improve Description: Including pain rating scale, medication(s)/side effects and non-pharmacologic comfort measures Outcome: Adequate for Discharge   Problem: Education: Goal: Knowledge of General Education information will improve Description: Including pain rating scale, medication(s)/side effects and non-pharmacologic comfort measures Outcome: Adequate for Discharge   Problem: Education: Goal: Knowledge of General Education information will improve Description: Including pain rating scale, medication(s)/side effects and non-pharmacologic comfort measures 02/16/2023 1721 by Tharon Aquas, RN Outcome: Adequate for Discharge 02/16/2023 1721 by Tharon Aquas, RN Outcome: Adequate for Discharge   Problem: Health Behavior/Discharge Planning: Goal: Ability to manage health-related needs will improve Outcome: Adequate for Discharge   Problem: Clinical Measurements: Goal: Ability to maintain clinical measurements within normal limits will improve Outcome: Adequate for Discharge Goal: Will remain free from infection Outcome: Adequate for Discharge Goal: Diagnostic test results will improve Outcome: Adequate for Discharge Goal: Respiratory complications will improve Outcome: Adequate for Discharge Goal: Cardiovascular complication will be avoided Outcome: Adequate for Discharge   Problem: Activity: Goal: Risk for activity intolerance will decrease Outcome: Adequate for Discharge   Problem: Nutrition: Goal: Adequate nutrition will be maintained Outcome: Adequate for Discharge   Problem: Coping: Goal: Level of anxiety will decrease Outcome: Adequate for Discharge   Problem: Elimination: Goal: Will not experience complications related to bowel motility Outcome: Adequate for Discharge Goal: Will not experience complications related to urinary retention Outcome: Adequate for Discharge   Problem: Pain  Managment: Goal: General experience of comfort will improve Outcome: Adequate for Discharge   Problem: Safety: Goal: Ability to remain free from injury will improve Outcome: Adequate for Discharge   Problem: Skin Integrity: Goal: Risk for impaired skin integrity will decrease Outcome: Adequate for Discharge

## 2023-02-16 NOTE — TOC CAGE-AID Note (Signed)
Transition of Care [facility]) - CAGE-AID Screening   Patient Details  Name: John Krueger MRN: [id number] Date of Birth: 25-May-1991  Transition of [facility]) CM/SW Contact:    Kermit Balo, RN Phone Number: 02/16/2023, 11:28 AM   Clinical Narrative: Pt refused inpatient/ outpatient alcohol counseling resources. Pt states he went to classes in 2017. He states he only socially drinks on weekends.    CAGE-AID Screening:    Have You Ever Felt You Ought to Cut Down on Your Drinking or Drug Use?: No Have People Annoyed You By Critizing Your Drinking Or Drug Use?: No Have You Felt Bad Or Guilty About Your Drinking Or Drug Use?: No Have You Ever Had a Drink or Used Drugs First Thing In The Morning to Steady Your Nerves or to Get Rid of a Hangover?: No CAGE-AID Score: 0  Substance Abuse Education Offered: Yes  Substance abuse interventions: Patient Counseling

## 2023-02-16 NOTE — Progress Notes (Signed)
Daily Progress Note Intern Pager: [phone]  Patient name: John Krueger Medical record number: [id number] Date of birth: [birth year] Age: 32 y.o. Gender: male  Primary Care Provider: Evette Georges, MD Consultants: Neurology, ID Code Status: Full  Pt Overview and Major Events to Date:  4/19-admitted  Assessment and Plan: John Krueger is a 32 year old male presenting with sudden onset seizure-like activity and vision changes.  Workup is ongoing and differential remains broad including nutritional deficiency, neurocutaneous disorder, vasculitis, infection (particular c/f filarial dz), and substance abuse d/o.  Pertinent PMH/PSH includes refugee status (from [place] since 2010).   * Seizure disorder Remains undifferentiated. Suspicion for nutrient deficiency vs indolent infection vs autoimmune process such as a vasculitis (though seeming less likely with nl MRI yesterday.  EEG showed no concern for seizure activity. Neuro raised concern for filarial dz given concomitant seizures, cerebral atrophy and vision changes.  ID was consulted, saw patient yesterday and thinks filarial/onchocercosis would be unlikely given presentation and the fact he has been in the [place] for over 15 years which is over the life expectancy of the organisms. Dr. Sherrine Maples, ophthalmologist saw patient yesterday with no findings suggestive of filarial disease but did recommend outpatient follow-up due to corneal scarring OS, ocular hypertension, and monocular diplopia OU.  Anemia panel ruled out iron deficiency anemia. -Neurology and ID on board, appreciate recommendations -May need LP if workup remains unrevealing -Continue Keppra 1000 mg twice daily -B12, folate, TSH all WNL, B1 pending. -f/u B6, Vit 6, Zinc -B3 deficiency is a possible explanation for neurodegenerative changes and acral dermatitis, but his dermatitis is atypical. Similarly,   B2 would account for perioral changes and anemia. Unfortunately these are both  send-out labs. Labs sent and will empirically start B complex vitamin.  -Seizure precautions -Vitals per unit routine   Other skin changes Lesions of the palms/soles with some lesions particularly c/f Janeway lesions. Neg STI workup including RPR. No fever or leukocytosis, but does have some arthralgias and with refugee status, possible hx of rheumatic fever/heart disease.  Patient declined skin biopsy during hospitalization, would prefer it be done outpatient. - f/u echo   Tobacco use Unknown amount of smoking for unknown amount of time.  He is interested in obtaining nicotine patch here in the hospital. -Nicotine patch 14 mg daily -TOC consult  Alcohol use Patient reports 2 cans of beer on the weekends, though his sister reports much more frequent use.  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 RUQ [place] with moderate hepatic steatosis, almost certainly from his alcohol use.  -CIWA protocol : have been 0 -TOC consult for substance abuse  GI bleeding Patient reported GI bleeding during recent office visit.  None noted while here and FOBT is negative.  Differential includes hemorrhoids.  Peutz-Jeghers syndrome less likely though on differential given characteristic lip findings on exam.   Chronic pain Most likely neuropathic in the setting of neurological dysfunction. -Continue home Robaxin 1000 mg twice daily and naproxen 500 twice daily    FEN/GI: Regular PPx: Lovenox Dispo: Home pending continued medical management  Subjective:  At bedside this morning, motivated to go home and increase activity.  Educated patient's on importance of alcohol cessation when he is home, he is pleased that he has not had any seizures during this hospitalization.   Objective: Temp:  [98 F (36.7 C)-99.1 F (37.3 C)] 99.1 F (37.3 C) (04/22 1138) Pulse Rate:  [57-76] 73 (04/22 1138) Resp:  [16-18] 16 (04/22  1138) BP: (123-140)/(86-94) 131/86 (04/22  1138) SpO2:  [98 %-100 %] 100 % (04/22 0759) Physical Exam: General: Alert, pleasant and cooperative CV: RRR, no M/R/G, normal S1/S2 Pulm: No work of breathing on room air, CTAB, no rhonchi crackles or wheezing GI: Nondistended, nontender, normoactive bowel sounds Skin: Hyperpigmented macular lesion on palms and soles; hypopigmented lower lip  Laboratory: Most recent CBC Lab Results  Component Value Date   WBC 4.0 02/16/2023   HGB 12.8 (L) 02/16/2023   HCT 37.4 (L) 02/16/2023   MCV 93.3 02/16/2023   PLT 89 (L) 02/16/2023   Most recent BMP    Latest Ref Rng & Units 02/16/2023    4:47 AM  BMP  Glucose 70 - 99 mg/dL 78   BUN 6 - 20 mg/dL 11   Creatinine 1.61 - 1.24 mg/dL 0.96   Sodium 045 - 409 mmol/L 135   Potassium 3.5 - 5.1 mmol/L 3.9   Chloride 98 - 111 mmol/L 101   CO2 22 - 32 mmol/L 21   Calcium 8.9 - 10.3 mg/dL 9.0     Lorri Frederick, MD 02/16/2023, 2:19 PM  PGY-1, [facility] FPTS Intern pager: [phone], text pages welcome Secure chat group [facility] Teaching Service

## 2023-02-17 ENCOUNTER — Telehealth: Payer: Self-pay

## 2023-02-17 LAB — CULTURE, BLOOD (ROUTINE X 2): Culture: NO GROWTH

## 2023-02-17 LAB — MISC LABCORP TEST (SEND OUT): Labcorp test code: 123220

## 2023-02-17 LAB — VITAMIN B6: Vitamin B6: 12.2 ug/L (ref 3.4–65.2)

## 2023-02-17 NOTE — Transitions of Care (Post Inpatient/ED Visit) (Signed)
   02/17/2023  Name: John Krueger MRN: [id number] DOB: January 01, 1991  Today's TOC FU Call Status: Today's TOC FU Call Status:: Successful TOC FU Call Competed TOC FU Call Complete Date: 02/17/23  Transition Care Management Follow-up Telephone Call Date of Discharge: 02/16/23 Discharge Facility: [facility]) Type of Discharge: Inpatient Admission Primary Inpatient Discharge Diagnosis:: convulsions How have you been since you were released from the hospital?: Better Any questions or concerns?: No  Items Reviewed: Did you receive and understand the discharge instructions provided?: Yes Medications obtained and verified?: Yes (Medications Reviewed) Any new allergies since your discharge?: No Dietary orders reviewed?: Yes Do you have support at home?: Yes People in Home: sibling(s)  Home Care and Equipment/Supplies: Were Home Health Services Ordered?: NA Any new equipment or medical supplies ordered?: NA  Functional Questionnaire: Do you need assistance with bathing/showering or dressing?: No Do you need assistance with meal preparation?: No Do you need assistance with eating?: No Do you have difficulty maintaining continence: No Do you need assistance with getting out of bed/getting out of a chair/moving?: No Do you have difficulty managing or taking your medications?: No  Follow up appointments reviewed: PCP Follow-up appointment confirmed?: No (no avail appt times, sent message to staff to schedule) MD Provider Line Number:[phone] Given: No Specialist Hospital Follow-up appointment confirmed?: No Reason Specialist Follow-Up Not Confirmed: Patient has Specialist Provider Number and will Call for Appointment Do you need transportation to your follow-up appointment?: No Do you understand care options if your condition(s) worsen?: Yes-patient verbalized understanding    SIGNATURE Karena Addison, LPN [facility] Nurse Health Advisor Direct Dial [phone]

## 2023-02-18 LAB — VITAMIN C: Vitamin C: 1.1 mg/dL (ref 0.4–2.0)

## 2023-02-18 LAB — ZINC: Zinc: 72 ug/dL (ref 44–115)

## 2023-02-18 NOTE — Telephone Encounter (Addendum)
Noticed hospital F/U appointment was not scheduled prior to discharge. I have attempted to contact the patient with no success. Dr. Marisue Humble notified and he will follow up with patient to schedule an appointment in the next 2-3 days.

## 2023-02-19 LAB — CULTURE, BLOOD (ROUTINE X 2): Special Requests: ADEQUATE

## 2023-02-23 LAB — MISC LABCORP TEST (SEND OUT)

## 2023-02-23 NOTE — Progress Notes (Unsigned)
    SUBJECTIVE:   CHIEF COMPLAINT / HPI:  Hospital follow-up Was admitted 4/19 - 4/22 for seizures. Keppra was increased to 1000 mg twice daily. Thought to be maybe med nonadherence vs alcohol related vs onchocerciasis but spot EEG and MRI brain were unremarkable. Neuro, ID, and ophtho all involved during admission.  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 Was seizure free on keppra in the hospital. Suspect medication non-adherence plays a large role in his frequent seizures. Follow up Vit B2, B2, C, and Zinc-- all normal 4.   Needs Vitamin B1 collected. Is already receiving empiric thiamine supplementation which we would recommend continuing in light of his ongoing etOH use.  Today reports no seizures since discharge. He does endorse continued difficulty with his balance and right sided pain which is chronic.   PERTINENT  PMH / PSH: per HPI  OBJECTIVE:   BP 129/86   Pulse 91   Ht 5\' 6"  (1.676 m)   Wt 174 lb 12.8 oz (79.3 kg)   SpO2 100%   BMI 28.21 kg/m   General: NAD, pleasant, able to participate in exam, appears older than age HEENT: unable to assess eyes well due to photophobia which is longstanding, TM normal bilaterally CV: RRR, normal S1/S2 without m/r/g Respiratory: No respiratory distress, lungs CTAB Skin: warm and dry, no rashes noted Psych: Normal affect and mood Neuro: CN III-XII intact, 5/5 strength in all extremities, sensation intact to light touch in all extremities, normal tone, normal speech, mildly antalgic gait due to right sided pain  ASSESSMENT/PLAN:   Seizure disorder (HCC) Seizure-free since discharge 1 week ago.  Continue Keppra 1000 mg twice daily.  Check vitamin B1 level today per recommendations from discharge summary.  Has neuro follow-up in  October--consider arranging sooner follow-up if recurrent issues.  Ambulatory dysfunction Chronic issues with balance and right-sided pain.  Etiology unclear but there are no red flags and extensive inpatient workup was unremarkable.  Referral to physical therapy.  Vision loss Gradual. Was seen by Ophtho during recent hospitalization- possible refractive error as well as ocular hypertension and corneal scarring.  Needs outpatient follow-up.  Referral placed today     Maury Dus, MD [facility]

## 2023-02-24 ENCOUNTER — Ambulatory Visit (INDEPENDENT_AMBULATORY_CARE_PROVIDER_SITE_OTHER): Payer: Medicaid Other | Admitting: Family Medicine

## 2023-02-24 VITALS — BP 129/86 | HR 91 | Ht 66.0 in | Wt 174.8 lb

## 2023-02-24 DIAGNOSIS — R262 Difficulty in walking, not elsewhere classified: Secondary | ICD-10-CM | POA: Diagnosis not present

## 2023-02-24 DIAGNOSIS — Z09 Encounter for follow-up examination after completed treatment for conditions other than malignant neoplasm: Secondary | ICD-10-CM

## 2023-02-24 DIAGNOSIS — G40909 Epilepsy, unspecified, not intractable, without status epilepticus: Secondary | ICD-10-CM

## 2023-02-24 DIAGNOSIS — R569 Unspecified convulsions: Secondary | ICD-10-CM

## 2023-02-24 DIAGNOSIS — H547 Unspecified visual loss: Secondary | ICD-10-CM | POA: Diagnosis not present

## 2023-02-24 NOTE — Patient Instructions (Addendum)
It was great to meet you!  I have ordered a referral to the eye doctor and to physical therapy. They should both call you to schedule an appointment.   We are checking one vitamin level today. I will send you a letter with the result or call if abnormal. It usually takes about 1 week to get the results.  Follow up with Dr Phineas Real next month.  Take care, Dr Anner Crete

## 2023-02-25 DIAGNOSIS — R262 Difficulty in walking, not elsewhere classified: Secondary | ICD-10-CM | POA: Insufficient documentation

## 2023-02-25 DIAGNOSIS — H547 Unspecified visual loss: Secondary | ICD-10-CM | POA: Insufficient documentation

## 2023-02-25 NOTE — Assessment & Plan Note (Addendum)
Gradual. Was seen by Ophtho during recent hospitalization- possible refractive error as well as ocular hypertension and corneal scarring.  Needs outpatient follow-up.  Referral placed today

## 2023-02-25 NOTE — Assessment & Plan Note (Addendum)
Chronic issues with balance and right-sided pain.  Etiology unclear but there are no red flags and extensive inpatient workup was unremarkable.  Referral to physical therapy.

## 2023-02-25 NOTE — Assessment & Plan Note (Signed)
Seizure-free since discharge 1 week ago.  Continue Keppra 1000 mg twice daily.  Check vitamin B1 level today per recommendations from discharge summary.  Has neuro follow-up in October--consider arranging sooner follow-up if recurrent issues.

## 2023-02-27 ENCOUNTER — Encounter: Payer: Self-pay | Admitting: Family Medicine

## 2023-02-27 LAB — VITAMIN B1: Thiamine: 98.8 nmol/L (ref 66.5–200.0)

## 2023-03-19 ENCOUNTER — Ambulatory Visit (INDEPENDENT_AMBULATORY_CARE_PROVIDER_SITE_OTHER): Payer: Medicaid Other | Admitting: Family Medicine

## 2023-03-19 ENCOUNTER — Encounter: Payer: Self-pay | Admitting: Family Medicine

## 2023-03-19 VITALS — BP 136/98 | HR 84 | Temp 99.1°F | Ht 66.0 in | Wt 169.8 lb

## 2023-03-19 DIAGNOSIS — G894 Chronic pain syndrome: Secondary | ICD-10-CM | POA: Diagnosis not present

## 2023-03-19 DIAGNOSIS — H547 Unspecified visual loss: Secondary | ICD-10-CM | POA: Diagnosis not present

## 2023-03-19 DIAGNOSIS — R262 Difficulty in walking, not elsewhere classified: Secondary | ICD-10-CM | POA: Diagnosis not present

## 2023-03-19 DIAGNOSIS — R569 Unspecified convulsions: Secondary | ICD-10-CM | POA: Diagnosis present

## 2023-03-19 DIAGNOSIS — E559 Vitamin D deficiency, unspecified: Secondary | ICD-10-CM

## 2023-03-19 DIAGNOSIS — Z720 Tobacco use: Secondary | ICD-10-CM | POA: Diagnosis not present

## 2023-03-19 MED ORDER — GABAPENTIN 100 MG PO CAPS: 100.0000 mg | ORAL_CAPSULE | Freq: Three times a day (TID) | ORAL | 0 refills | Status: DC

## 2023-03-19 NOTE — Assessment & Plan Note (Signed)
Possible refractive error vs ocular hypertension and corneal scarring per optho in the hospital. Will reach out to referral coordinator about finding an appointment closer to [place].

## 2023-03-19 NOTE — Progress Notes (Signed)
    SUBJECTIVE:   CHIEF COMPLAINT / HPI:   Seizures Neurology follow up in October 2024. Vitamin B1 level normal from last visit. On Keppra 1000 mg BID. Still having episodes similar to before hospitalization in 01/2023 of being tremulous, diaphoretic, confused, in pain, and having mood changes fast ("like he is sick"). Has pain for next few days after these episodes as below. Has not been drinking recently (and he had not been drinking when these symptoms first started). Sister Doristine Counter is present and confirms history.  Chronic pain Continues to have pain on the R side diffusely with burning around his spine as per prior notes. This results in him having difficulty moving.  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 They were referred to a practice in [place], but they would like somewhere in [place], if possible.  Alcohol and tobacco use Vitamin B1 level normal at last visit. Has not been drinking since discharge. Smokes 1-2 cigarettes still. Has been for last year.  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, vitamin D deficiency (s/p supplementation), moderate hepatic steatosis (downtrended LFTs)  OBJECTIVE:   BP (!) 136/98   Pulse 84   Temp 99.1 F (37.3 C)   Ht 5\' 6"  (1.676 m)   Wt 77 kg   SpO2 100%   BMI 27.41 kg/m   General: Alert and oriented, in NAD Skin: Warm, dry, and intact HEENT: NCAT, EOM grossly normal, midline nasal septum Cardiac: Regular rate Respiratory: Breathing and speaking comfortably on RA Abdominal: Nondistended Extremities: Moves all extremities grossly equally Neurological: No gross change from prior exams Psychiatric: Appropriate mood and affect   ASSESSMENT/PLAN:   Seizure-like activity (HCC) Remains on Keppra as prescribed but continues to have episodes of similar semiology from prior. Workup from hospitalization largely unremarkable other than known advanced  brain atrophy (unknown cause). Vitamin workup and EEG at that time normal. While he does not endorse worsening of episodes with stress, still consider psychosomatic etiology. He has neurology follow up in October, but I will reach out to team and see if they could fit him in sooner given the debilitating nature of his symptoms. Until then, will attempt to treat his pain as below. Continue Keppra.  Chronic pain Stable. Continues to take robaxin and naproxen. Given no help, will discontinue these (and stopping naproxen given subjective GI bleeding). Will initiate gabapentin 100 mg TID given chronic pain symptoms associated with burning in the back that could have a neuropathic component. Follow up in 1 month to assess improvement.  Ambulatory dysfunction Unable to receive call from PT. Will reach out to referral coordinator.  Vision loss Possible refractive error vs ocular hypertension and corneal scarring per optho in the hospital. Will reach out to referral coordinator about finding an appointment closer to [place].  Vitamin D deficiency Finished vitamin D 50,000 units weekly for 4 weeks. Will recheck at next visit.   Janeal Holmes, MD [facility]

## 2023-03-19 NOTE — Assessment & Plan Note (Signed)
Stable. Continues to take robaxin and naproxen. Given no help, will discontinue these (and stopping naproxen given subjective GI bleeding). Will initiate gabapentin 100 mg TID given chronic pain symptoms associated with burning in the back that could have a neuropathic component. Follow up in 1 month to assess improvement.

## 2023-03-19 NOTE — Assessment & Plan Note (Signed)
Finished vitamin D 50,000 units weekly for 4 weeks. Will recheck at next visit.

## 2023-03-19 NOTE — Patient Instructions (Signed)
It was great to see you today! Here's what we talked about:  I have started you on a medication called gabapentin that could help your pain. Take it THREE times a day. Let me know if you have any problems. It can make you sleepy, so be cautious when using machinery or driving. Keep taking your keppra as prescribed. STOP the naproxen and robaxin since these do not help. Continue cutting down on alcohol and tobacco use as able. I will see if physical therapy can get in touch with you, and I will see if we can get a closer ophthalmologist for you. I have attached dental resources below.  Please let me know if you have any other questions.  Dr. Phineas Real  Dental list         Updated 11.20.18 These dentists all accept Medicaid.  The list is a courtesy and for your convenience. Estos dentistas aceptan Medicaid.  La lista es para su [place] y es una cortesa.     [facility]     [phone] [address]  [address] [place] [address] Se habla espaol From 39 to 14 years old Parent may go with child only for cleaning Vinson Moselle DDS     [phone] Milus Banister, DDS (Spanish speaking) [address] [place]  [address] Se habla espaol From 32 to 51 years old Parent may go with child   Marolyn Hammock DMD    [phone] [address] [place] [address] Se habla espaol [place] spoken From 24 years old Parent may go with child [facility]     [phone] [address] [place] [address] Se habla espaol From 79 to 10 years old Parent may NOT go with child  Winfield Rast DDS  [phone] [facility]      [address]  Ginette Otto [place] [address] Se habla espaol [place] spoken (preferred to bring translator) From teeth coming in to 40 years old Parent may go with child  [facility] Dept.     [phone] [address] [place] [place] [address] Requires certification. Call for  information. Requiere certificacin. Llame para informacin. Algunos dias se habla espaol  From birth to 20 years Parent possibly goes with child   Bradd Canary DDS     [phone] [id number]  [address] [place] [address] Se habla espaol From 18 months to 18 years  Parent may go with child  [facility] DDS     Garlon Hatchet DDS  [phone] [address] [place] [address] Se habla espaol From 3 year old Parent may go with child   Melynda Ripple DDS    [phone] [address] [place] [address] Se habla espaol  From 18 months to 31 years old Parent may go with child Dorian Pod DDS    [phone] [address] [place] [address] Se habla espaol From 54 to 71 years old Parent may go with child  [facility]    [phone] [address] [place] [address] No se Wayne Sever From birth [facility]  [phone] [address] Ginette [place] [address] Se habla espanol Interpretation for other languages Special needs children welcome  Geryl Councilman, DDS [facility]     [phone] [address]  [place], [place] [address] From 30 years old   Special needs children welcome  [facility]   [phone] Dr. Orlean Patten [address] [place], [place]  [address] Se habla espaol From birth to 12 years Special needs children welcome   [facility] [place] [phone] [address] [place], [place] [address]   [facility] [phone] [address] [place], [place] [address]

## 2023-03-19 NOTE — Assessment & Plan Note (Signed)
Unable to receive call from PT. Will reach out to referral coordinator.

## 2023-03-19 NOTE — Assessment & Plan Note (Signed)
Remains on Keppra as prescribed but continues to have episodes of similar semiology from prior. Workup from hospitalization largely unremarkable other than known advanced brain atrophy (unknown cause). Vitamin workup and EEG at that time normal. While he does not endorse worsening of episodes with stress, still consider psychosomatic etiology. He has neurology follow up in October, but I will reach out to team and see if they could fit him in sooner given the debilitating nature of his symptoms. Until then, will attempt to treat his pain as below. Continue Keppra.

## 2023-03-26 ENCOUNTER — Telehealth: Payer: Self-pay

## 2023-03-26 NOTE — Telephone Encounter (Signed)
-----   Message from Evette Georges, MD sent at 03/23/2023  7:08 AM EDT ----- Regarding: Referral follow up Hi!  Can we call this patient and let him know that this is the number to call the ophthalmologist in [place] to schedule an appointment?  Also, PT is still reviewing his case, so please advise him to wait for a call from them.  Thanks so much! ----- Message ----- From: Henri Medal, CMA Sent: 03/20/2023   8:23 AM EDT To: Evette Georges, MD  Morning,  I referred him to the [facility] here in [place].  It was just faxed to them this week, so they will reach out to the patient (unfortunately the preferred office didn't accept his insurance).  Also PT has the referral but they are reviewing it on their end before they call him to schedule.    [facility] [address] [place], [place] [address] [phone]  [facility] at [facility] Address: [address] [place], [place], [place] [address] Phone: [phone]   ----- Message ----- From: Evette Georges, MD Sent: 03/19/2023   5:00 PM EDT To: Henri Medal, CMA  Hi! Two things about his referrals: 1. They were under the impression that they were referred to [facility] optho in [place] -- can you confirm that they are referred to the [place] location and help them get connected? 2. They have not received a PT referral, and all I can see is that it is "under review" -- any updates you can get for this? Thanks so much! Branden :)

## 2023-03-26 NOTE — Telephone Encounter (Signed)
LVM on pt cell phone with information from Dr Phineas Real. Sunday Spillers, CMA

## 2023-05-01 ENCOUNTER — Ambulatory Visit: Payer: Self-pay | Admitting: Family Medicine

## 2023-05-04 ENCOUNTER — Emergency Department (HOSPITAL_COMMUNITY)
Admission: EM | Admit: 2023-05-04 | Discharge: 2023-05-05 | Disposition: A | Discharge status: Home / Self Care | Payer: Medicaid Other | Attending: Emergency Medicine | Admitting: Emergency Medicine

## 2023-05-04 ENCOUNTER — Encounter (HOSPITAL_COMMUNITY): Payer: Self-pay

## 2023-05-04 ENCOUNTER — Emergency Department (HOSPITAL_COMMUNITY): Payer: Medicaid Other

## 2023-05-04 ENCOUNTER — Other Ambulatory Visit: Payer: Self-pay

## 2023-05-04 DIAGNOSIS — E162 Hypoglycemia, unspecified: Secondary | ICD-10-CM | POA: Diagnosis not present

## 2023-05-04 DIAGNOSIS — R110 Nausea: Secondary | ICD-10-CM | POA: Diagnosis present

## 2023-05-04 DIAGNOSIS — R519 Headache, unspecified: Secondary | ICD-10-CM | POA: Diagnosis not present

## 2023-05-04 LAB — BASIC METABOLIC PANEL
Anion gap: 23 — ABNORMAL HIGH (ref 5–15)
BUN: 10 mg/dL (ref 6–20)
CO2: 15 mmol/L — ABNORMAL LOW (ref 22–32)
Calcium: 9.4 mg/dL (ref 8.9–10.3)
Chloride: 94 mmol/L — ABNORMAL LOW (ref 98–111)
Creatinine, Ser: 0.81 mg/dL (ref 0.61–1.24)
GFR, Estimated: >60 mL/min (ref >60)
Glucose, Bld: 68 mg/dL — ABNORMAL LOW (ref 70–99)
Potassium: 3.7 mmol/L (ref 3.5–5.1)
Sodium: 132 mmol/L — ABNORMAL LOW (ref 135–145)

## 2023-05-04 LAB — CBG MONITORING, ED: Glucose-Capillary: 72 mg/dL (ref 70–99)

## 2023-05-04 LAB — CBC
HCT: 39 % (ref 39.0–52.0)
Hemoglobin: 13.3 g/dL (ref 13.0–17.0)
MCH: 32.3 pg (ref 26.0–34.0)
MCHC: 34.1 g/dL (ref 30.0–36.0)
MCV: 94.7 fL (ref 80.0–100.0)
Platelets: 119 10*3/uL — ABNORMAL LOW (ref 150–400)
RBC: 4.12 MIL/uL — ABNORMAL LOW (ref 4.22–5.81)
RDW: 14.3 % (ref 11.5–15.5)
WBC: 5.5 10*3/uL (ref 4.0–10.5)
nRBC: 0 % (ref 0.0–0.2)

## 2023-05-04 MED ORDER — ONDANSETRON 4 MG PO TBDP
4.0000 mg | ORAL_TABLET | Freq: Once | ORAL | Status: AC
  Administered 2023-05-04: 4 mg via ORAL
  Filled 2023-05-04: qty 1

## 2023-05-04 MED ORDER — LORAZEPAM 1 MG PO TABS
0.5000 mg | ORAL_TABLET | Freq: Once | ORAL | Status: AC
  Administered 2023-05-04: 0.5 mg via ORAL
  Filled 2023-05-04: qty 1

## 2023-05-04 NOTE — ED Provider Triage Note (Signed)
Emergency Medicine Provider Triage Evaluation Note  John Krueger , a 32 y.o. male  was evaluated in triage.  Pt complains of feels like seizure coming on.  Review of Systems  Positive: headache Negative: No actual seizure  Physical Exam  BP (!) 121/91   Pulse 83   Temp 98.2 F (36.8 C) (Oral)   Resp 18   Ht 5\' 6"  (1.676 m)   Wt 76.7 kg   SpO2 99%   BMI 27.28 kg/m  Gen:   Awake, no distress   Resp:  Normal effort  MSK:   Moves extremities without difficulty  Other:    Medical Decision Making  Medically screening exam initiated at 8:55 PM.  Appropriate orders placed.  John Krueger was informed that the remainder of the evaluation will be completed by another provider, this initial triage assessment does not replace that evaluation, and the importance of remaining in the ED until their evaluation is complete.     Virgina Norfolk, DO 05/04/23 2057

## 2023-05-04 NOTE — ED Triage Notes (Addendum)
Pt BIB [facility] from home for "impending seizure". Pt reports 2 day hx back pain radiating to legs and head. Pt states that this usually occurs before he has a seizure. Last seizure 1 month ago. Pt also reports intermittent weakness in R leg radiating to back "that happens twice per week".  CBG 86 136/74 80HR

## 2023-05-05 LAB — CBG MONITORING, ED
Glucose-Capillary: 53 mg/dL — ABNORMAL LOW (ref 70–99)
Glucose-Capillary: 84 mg/dL (ref 70–99)

## 2023-05-05 MED ORDER — PROCHLORPERAZINE EDISYLATE 10 MG/2ML IJ SOLN
10.0000 mg | Freq: Once | INTRAMUSCULAR | Status: AC
  Administered 2023-05-05: 10 mg via INTRAVENOUS
  Filled 2023-05-05: qty 2

## 2023-05-05 MED ORDER — SODIUM CHLORIDE 0.9 % IV BOLUS
1000.0000 mL | Freq: Once | INTRAVENOUS | Status: AC
  Administered 2023-05-05: 1000 mL via INTRAVENOUS

## 2023-05-05 NOTE — Discharge Instructions (Addendum)
Followup with your doctor as needed

## 2023-05-05 NOTE — ED Provider Notes (Signed)
[place] EMERGENCY DEPARTMENT AT [facility] Provider Note   CSN: [id number] Arrival date & time: 05/04/23  2027     History  Chief Complaint  Patient presents with   Seizures    John Krueger is a 32 y.o. male.   Seizures Patient with history of seizures.  Presented today with nausea and feeling bad.  States some headache.  States he felt as though he could have a seizure.  Continued headache.  Some continued nausea.  Found to be hypoglycemic.  Will treat.  No history of diabetes.  Reported compliant with his medicines.  No fevers.  No cough.    Past Medical History:  Diagnosis Date   Seizures (HCC)     Home Medications Prior to Admission medications   Medication Sig Start Date End Date Taking? Authorizing Provider  B Complex-C (B-COMPLEX WITH VITAMIN C) tablet Take 1 tablet by mouth daily. 02/17/23   Carrion-Carrero, Karle Starch, MD  folic acid (FOLVITE) 1 MG tablet Take 1 tablet (1 mg total) by mouth daily. 02/17/23   Carrion-Carrero, Karle Starch, MD  gabapentin (NEURONTIN) 100 MG capsule Take 1 capsule (100 mg total) by mouth 3 (three) times daily. 03/19/23 04/18/23  Evette Georges, MD  levETIRAcetam (KEPPRA) 1000 MG tablet Take 1 tablet (1,000 mg total) by mouth 2 (two) times daily. 02/16/23   Carrion-Carrero, Karle Starch, MD  Multiple Vitamin (MULTIVITAMIN WITH MINERALS) TABS tablet Take 1 tablet by mouth daily. 02/17/23   Carrion-Carrero, Karle Starch, MD  thiamine (VITAMIN B-1) 100 MG tablet Take 1 tablet (100 mg total) by mouth daily. 02/17/23   Carrion-Carrero, Karle Starch, MD  Vitamin D, Ergocalciferol, (DRISDOL) 1.25 MG (50000 UNIT) CAPS capsule Take 1 capsule (50,000 Units total) by mouth every 7 (seven) days. 02/22/23   Carrion-Carrero, Karle Starch, MD      Allergies    Patient has no known allergies.    Review of Systems   Review of Systems  Neurological:  Positive for seizures.    Physical Exam Updated Vital Signs BP 121/82   Pulse 71   Temp (!) 97 F (36.1 C) (Oral)   Resp  16   Ht 5\' 6"  (1.676 m)   Wt 76.7 kg   SpO2 100%   BMI 27.28 kg/m  Physical Exam Vitals and nursing note reviewed.  Cardiovascular:     Rate and Rhythm: Regular rhythm.  Pulmonary:     Breath sounds: No wheezing.  Abdominal:     Tenderness: There is no abdominal tenderness.  Musculoskeletal:     Cervical back: Neck supple.  Neurological:     Mental Status: He is alert. Mental status is at baseline.     ED Results / Procedures / Treatments   Labs (all labs ordered are listed, but only abnormal results are displayed) Labs Reviewed  BASIC METABOLIC PANEL - Abnormal; Notable for the following components:      Result Value   Sodium 132 (*)    Chloride 94 (*)    CO2 15 (*)    Glucose, Bld 68 (*)    Anion gap 23 (*)    All other components within normal limits  CBC - Abnormal; Notable for the following components:   RBC 4.12 (*)    Platelets 119 (*)    All other components within normal limits  CBG MONITORING, ED - Abnormal; Notable for the following components:   Glucose-Capillary 53 (*)    All other components within normal limits  URINALYSIS, ROUTINE W REFLEX MICROSCOPIC  CBG MONITORING, ED  CBG MONITORING, ED    EKG None  Radiology CT Head Wo Contrast  Result Date: 05/04/2023 CLINICAL DATA:  Mental status change EXAM: CT HEAD WITHOUT CONTRAST TECHNIQUE: Contiguous axial images were obtained from the base of the skull through the vertex without intravenous contrast. RADIATION DOSE REDUCTION: This exam was performed according to the departmental dose-optimization program which includes automated exposure control, adjustment of the mA and/or kV according to patient size and/or use of iterative reconstruction technique. COMPARISON:  CT brain 11/07/2022, MRI 02/14/2023 FINDINGS: Brain: No acute territorial infarction, hemorrhage or intracranial mass. Generalized age advanced volume loss as before. Prominent ventricles felt secondary to volume loss. Vascular: No hyperdense  vessels.  No unexpected calcification Skull: Normal. Negative for fracture or focal lesion. Sinuses/Orbits: No acute finding. Other: None IMPRESSION: 1. No CT evidence for acute intracranial abnormality. 2. Generalized age advanced volume loss. Electronically Signed   By: Jasmine Pang M.D.   On: 05/04/2023 21:24    Procedures Procedures    Medications Ordered in ED Medications  LORazepam (ATIVAN) tablet 0.5 mg (0.5 mg Oral Given 05/04/23 2132)  ondansetron (ZOFRAN-ODT) disintegrating tablet 4 mg (4 mg Oral Given 05/04/23 2132)  sodium chloride 0.9 % bolus 1,000 mL (0 mLs Intravenous Stopped 05/05/23 0939)  prochlorperazine (COMPAZINE) injection 10 mg (10 mg Intravenous Given 05/05/23 0802)    ED Course/ Medical Decision Making/ A&P                             Medical Decision Making Amount and/or Complexity of Data Reviewed Labs: ordered.  Risk Prescription drug management.   patient feeling off with nausea.  Has hypoglycemia.  Head CT reassuring.  Differential diagnosis includes dehydration less eating.  Patient fed and doing better.  Lab work overall reassuring.  Sugar has stayed up.  Doubt seizure.  Outpatient follow-up with PCP and neurology.        Final Clinical Impression(s) / ED Diagnoses Final diagnoses:  None    Rx / DC Orders ED Discharge Orders     None         Benjiman Core, MD 05/05/23 [phone]

## 2023-05-05 NOTE — ED Notes (Signed)
CBG is 53. Pt is alert and oriented x 4. Currently eating [place] sandwich and apple juice. Pt denies hx of DM. Will notify MD.

## 2023-05-22 ENCOUNTER — Encounter: Payer: Self-pay | Admitting: Family Medicine

## 2023-05-22 ENCOUNTER — Ambulatory Visit (INDEPENDENT_AMBULATORY_CARE_PROVIDER_SITE_OTHER): Payer: Medicaid Other | Admitting: Family Medicine

## 2023-05-22 VITALS — BP 120/102 | HR 98 | Temp 98.8°F | Wt 163.8 lb

## 2023-05-22 DIAGNOSIS — R569 Unspecified convulsions: Secondary | ICD-10-CM | POA: Diagnosis not present

## 2023-05-22 DIAGNOSIS — G894 Chronic pain syndrome: Secondary | ICD-10-CM

## 2023-05-22 DIAGNOSIS — H547 Unspecified visual loss: Secondary | ICD-10-CM

## 2023-05-22 DIAGNOSIS — E559 Vitamin D deficiency, unspecified: Secondary | ICD-10-CM

## 2023-05-22 NOTE — Progress Notes (Signed)
SUBJECTIVE:   CHIEF COMPLAINT / HPI:   Seizure-like activity, chronic pain Recently seen in ED 7/9 for "impending" SLA. Had glucose of 53 that improved with food. CT head with known advanced atrophy. He also had pain radiating to his legs and head at that time with some R leg weakness that happens twice a week. Does not appear he has been taking his keppra or gabapentin, and he has not been able to follow with PT. He states he would like to use the [facility] on [address] for future refills, however. Still has neurology appointment in October 2024. His sister called in to the visit and explains that he is "confused" all the time. He does not want to cooperate with them at home or at other doctor appointments. She feels he consistently has "mood swings."  Vitamin D deficiency Finished weekly supplementation per last visit.  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 He went to one before this according to his sister, but he was not able to cooperate with them. They stated she would need to be in the office with him at this next appointment in August.  Heat intolerance Feels he has been much more hot recently. He turns his AC up to "90 degrees" but is still very hot. The only way he feels better is if he takes off his clothes and sits outside. This has gotten much worse in the summer time.  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, vitamin D deficiency (s/p supplementation), moderate hepatic steatosis (downtrended LFTs)  OBJECTIVE:   BP (!) 120/102 (BP Location: Right Arm, Patient Position: Sitting, Cuff Size: Normal)   Pulse 98   Temp 98.8 F (37.1 C)   Wt 163 lb 12.8 oz (74.3 kg)   SpO2 100%   BMI 26.44 kg/m   General: Alert and oriented, in NAD HEENT: NCAT, greying hair, head positioning curled down toward chest without much eye contact Respiratory: Breathing and  speaking comfortably on RA Extremities: Moves all extremities grossly equally Neurological: No gross focal deficit from baseline exam Psychiatric: Appropriate mood and affect   ASSESSMENT/PLAN:   Vitamin D deficiency Levels unable to be rechecked today given time constraints. Consider rechecking at next visit.  Vision loss Optometry appointment 06/18/23.  Seizure-like activity (HCC) Uncontrolled. Unusual semiology as per prior. Has multiple somatic symptoms associated with episodes. 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 Initially reports taking Keppra though fill history in EMR states otherwise. Reiterated importance of taking this medication every single day, twice a day. Referred to social work for [facility] for aid obtaining transport/financial assistance. Advised he could contact his personal care company through [place] as well to find an aide to help him at home. He has a follow up appointment with neurology in 07/2023. Follow up in 1 month after consistent medication use.  Chronic pain Pain levels and heat intolerance consistent with some element of neuropathic etiology, though I feel his house may simply be too hot. He does have a language barrier I believe when describing this, but he declines interpreter. Unfortunately he has not been taking gabapentin for these pains. Reiterated importance of trying this medication. Follow up in 1 month.   Elevated BP BP elevated multiple readings now, though they appear to be more elevated when patient is in pain. Will assess at next  visit and discuss lifestyle interventions.  Health maintenance Consider obtaining Tdap and COVID booster at next visit, if desired.  Janeal Holmes, MD [facility]

## 2023-05-22 NOTE — Patient Instructions (Addendum)
It was great to see you today! Here's what we talked about:  Please be sure to take your keppra and gabapentin. I have refilled these. I will see if a personal nurse can come help you with your medicines.  Please let me know if you have any other questions.  Dr. Phineas Real

## 2023-05-23 MED ORDER — FOLIC ACID 1 MG PO TABS: 1.0000 mg | ORAL_TABLET | Freq: Every day | ORAL | 0 refills | Status: DC

## 2023-05-23 MED ORDER — LEVETIRACETAM 1000 MG PO TABS: 1000.0000 mg | ORAL_TABLET | Freq: Two times a day (BID) | ORAL | 0 refills | Status: DC

## 2023-05-23 MED ORDER — VITAMIN B-1 100 MG PO TABS: 100.0000 mg | ORAL_TABLET | Freq: Every day | ORAL | 0 refills | Status: DC

## 2023-05-23 MED ORDER — GABAPENTIN 100 MG PO CAPS: 100.0000 mg | ORAL_CAPSULE | Freq: Three times a day (TID) | ORAL | 0 refills | Status: DC

## 2023-05-23 NOTE — Assessment & Plan Note (Signed)
Pain levels and heat intolerance consistent with some element of neuropathic etiology, though I feel his house may simply be too hot. He does have a language barrier I believe when describing this, but he declines interpreter. Unfortunately he has not been taking gabapentin for these pains. Reiterated importance of trying this medication. Follow up in 1 month.

## 2023-05-23 NOTE — Assessment & Plan Note (Addendum)
Uncontrolled. Unusual semiology as per prior. Has multiple somatic symptoms associated with episodes. 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 Initially reports taking Keppra though fill history in EMR states otherwise. Reiterated importance of taking this medication every single day, twice a day. Referred to social work for [facility] for aid obtaining transport/financial assistance. Advised John Krueger could contact his personal care company through [place] as well to find an aide to help him at home. John Krueger has a follow up appointment with neurology in 07/2023. Follow up in 1 month after consistent medication use.

## 2023-05-23 NOTE — Assessment & Plan Note (Signed)
Levels unable to be rechecked today given time constraints. Consider rechecking at next visit.

## 2023-05-23 NOTE — Assessment & Plan Note (Signed)
Optometry appointment 06/18/23.

## 2023-05-23 NOTE — Assessment & Plan Note (Deleted)
Uncontrolled. Unusual semiology as per prior. Has multiple somatic symptoms associated with episodes. Initially reports taking Keppra though fill history in EMR states otherwise. Reiterated importance of taking this medication every single day, twice a day. He has a follow up appointment with neurology in 07/2023. Follow up in 1 month after consistent medication use.

## 2023-06-22 ENCOUNTER — Encounter (HOSPITAL_COMMUNITY): Payer: Self-pay | Admitting: Emergency Medicine

## 2023-06-22 ENCOUNTER — Emergency Department (HOSPITAL_COMMUNITY)
Admission: EM | Admit: 2023-06-22 | Discharge: 2023-06-22 | Disposition: A | Discharge status: Home / Self Care | Payer: Medicaid Other | Attending: Emergency Medicine | Admitting: Emergency Medicine

## 2023-06-22 ENCOUNTER — Other Ambulatory Visit: Payer: Self-pay

## 2023-06-22 DIAGNOSIS — R258 Other abnormal involuntary movements: Secondary | ICD-10-CM | POA: Insufficient documentation

## 2023-06-22 DIAGNOSIS — R569 Unspecified convulsions: Secondary | ICD-10-CM | POA: Diagnosis present

## 2023-06-22 LAB — CBC WITH DIFFERENTIAL/PLATELET
Abs Immature Granulocytes: 0.01 10*3/uL (ref 0.00–0.07)
Basophils Absolute: 0 10*3/uL (ref 0.0–0.1)
Basophils Relative: 1 %
Eosinophils Absolute: 0.1 10*3/uL (ref 0.0–0.5)
Eosinophils Relative: 1 %
HCT: 36.8 % — ABNORMAL LOW (ref 39.0–52.0)
Hemoglobin: 12.9 g/dL — ABNORMAL LOW (ref 13.0–17.0)
Immature Granulocytes: 0 %
Lymphocytes Relative: 45 %
Lymphs Abs: 2.3 10*3/uL (ref 0.7–4.0)
MCH: 32.7 pg (ref 26.0–34.0)
MCHC: 35.1 g/dL (ref 30.0–36.0)
MCV: 93.4 fL (ref 80.0–100.0)
Monocytes Absolute: 0.6 10*3/uL (ref 0.1–1.0)
Monocytes Relative: 12 %
Neutro Abs: 2.1 10*3/uL (ref 1.7–7.7)
Neutrophils Relative %: 41 %
Platelets: 105 10*3/uL — ABNORMAL LOW (ref 150–400)
RBC: 3.94 MIL/uL — ABNORMAL LOW (ref 4.22–5.81)
RDW: 14.4 % (ref 11.5–15.5)
WBC: 5.1 10*3/uL (ref 4.0–10.5)
nRBC: 0 % (ref 0.0–0.2)

## 2023-06-22 LAB — COMPREHENSIVE METABOLIC PANEL
ALT: 34 U/L (ref 0–44)
AST: 86 U/L — ABNORMAL HIGH (ref 15–41)
Albumin: 3.4 g/dL — ABNORMAL LOW (ref 3.5–5.0)
Alkaline Phosphatase: 74 U/L (ref 38–126)
Anion gap: 14 (ref 5–15)
BUN: 5 mg/dL — ABNORMAL LOW (ref 6–20)
CO2: 23 mmol/L (ref 22–32)
Calcium: 9.2 mg/dL (ref 8.9–10.3)
Chloride: 96 mmol/L — ABNORMAL LOW (ref 98–111)
Creatinine, Ser: 0.91 mg/dL (ref 0.61–1.24)
GFR, Estimated: >60 mL/min (ref >60)
Glucose, Bld: 78 mg/dL (ref 70–99)
Potassium: 3.4 mmol/L — ABNORMAL LOW (ref 3.5–5.1)
Sodium: 133 mmol/L — ABNORMAL LOW (ref 135–145)
Total Bilirubin: 0.7 mg/dL (ref 0.3–1.2)
Total Protein: 6.4 g/dL — ABNORMAL LOW (ref 6.5–8.1)

## 2023-06-22 MED ORDER — LEVETIRACETAM 500 MG PO TABS
1000.0000 mg | ORAL_TABLET | Freq: Once | ORAL | Status: AC
  Administered 2023-06-22: 1000 mg via ORAL
  Filled 2023-06-22: qty 2

## 2023-06-22 NOTE — ED Provider Notes (Signed)
[place] EMERGENCY DEPARTMENT AT [facility] Provider Note   CSN: [id number] Arrival date & time: 06/22/23  2145     History  Chief Complaint  Patient presents with   Seizures    John Krueger is a 32 y.o. male Struve seizures on Keppra and Depakote presented for seizure-like activity that occurred at 7 AM this morning.  Patient is unsure how long it lasted as this was unwitnessed.  Patient states he did not have breakfast at that time and feels that his sugar may have been low as he has had that happen to him in the past.  Patient states that the episode he felt was similar to previous seizures.  Patient denies hitting his head or being on blood thinners.  Patient states he is compliant with his Keppra and Depakote.  Patient states he has not had alcohol in 2 years.  Patient states he was nauseous before his seizure began but states that is resolved.  Patient denies recent illness, chest pain, shortness of breath, abdominal pain, emesis, change in sensation/motor skills, new onset weakness, neck pain, recent head trauma, head pain, vision changes, medication changes  Home Medications Prior to Admission medications   Medication Sig Start Date End Date Taking? Authorizing Provider  B Complex-C (B-COMPLEX WITH VITAMIN C) tablet Take 1 tablet by mouth daily. 02/17/23   Carrion-Carrero, Karle Starch, MD  folic acid (FOLVITE) 1 MG tablet Take 1 tablet (1 mg total) by mouth daily. 05/23/23   Evette Georges, MD  gabapentin (NEURONTIN) 100 MG capsule Take 1 capsule (100 mg total) by mouth 3 (three) times daily. 05/23/23 06/22/23  Evette Georges, MD  levETIRAcetam (KEPPRA) 1000 MG tablet Take 1 tablet (1,000 mg total) by mouth 2 (two) times daily. 05/23/23   Evette Georges, MD  Multiple Vitamin (MULTIVITAMIN WITH MINERALS) TABS tablet Take 1 tablet by mouth daily. 02/17/23   Carrion-Carrero, Karle Starch, MD  thiamine (VITAMIN B-1) 100 MG tablet Take 1 tablet (100 mg total) by mouth daily. 05/23/23   Evette Georges, MD  Vitamin D, Ergocalciferol, (DRISDOL) 1.25 MG (50000 UNIT) CAPS capsule Take 1 capsule (50,000 Units total) by mouth every 7 (seven) days. 02/22/23   Carrion-Carrero, Karle Starch, MD      Allergies    Patient has no known allergies.    Review of Systems   Review of Systems  Neurological:  Positive for seizures.    Physical Exam Updated Vital Signs BP (!) 169/116 (BP Location: Right Arm)   Pulse 70   Temp 98.8 F (37.1 C) (Oral)   Resp (!) 22   Wt 75 kg   SpO2 99%   BMI 26.69 kg/m  Physical Exam Constitutional:      General: He is not in acute distress.    Comments: Resting comfortably on phone No urinary or bowel incontinence noted  HENT:     Mouth/Throat:     Mouth: Mucous membranes are moist.     Pharynx: No posterior oropharyngeal erythema.     Comments: No tongue biting noted Eyes:     Extraocular Movements: Extraocular movements intact.     Conjunctiva/sclera: Conjunctivae normal.     Pupils: Pupils are equal, round, and reactive to light.  Cardiovascular:     Rate and Rhythm: Normal rate and regular rhythm.     Pulses: Normal pulses.     Heart sounds: Normal heart sounds.  Pulmonary:     Effort: Pulmonary effort is normal. No respiratory distress.     Breath sounds: Normal  breath sounds.  Abdominal:     Palpations: Abdomen is soft.     Tenderness: There is no abdominal tenderness. There is no guarding or rebound.  Musculoskeletal:        General: Normal range of motion.     Cervical back: Normal range of motion.  Skin:    General: Skin is warm and dry.     Capillary Refill: Capillary refill takes less than 2 seconds.  Neurological:     Mental Status: He is alert.     Sensory: Sensation is intact.     Motor: Motor function is intact.     Coordination: Coordination is intact.     Gait: Gait is intact.     Comments: Cranial nerves III through XII intact Vision grossly intact No tremors noted  Psychiatric:        Mood and Affect: Mood normal.      ED Results / Procedures / Treatments   Labs (all labs ordered are listed, but only abnormal results are displayed) Labs Reviewed  COMPREHENSIVE METABOLIC PANEL - Abnormal; Notable for the following components:      Result Value   Sodium 133 (*)    Potassium 3.4 (*)    Chloride 96 (*)    BUN 5 (*)    Total Protein 6.4 (*)    Albumin 3.4 (*)    AST 86 (*)    All other components within normal limits  CBC WITH DIFFERENTIAL/PLATELET - Abnormal; Notable for the following components:   RBC 3.94 (*)    Hemoglobin 12.9 (*)    HCT 36.8 (*)    Platelets 105 (*)    All other components within normal limits  LEVETIRACETAM LEVEL  VALPROIC ACID LEVEL    EKG None  Radiology No results found.  Procedures Procedures    Medications Ordered in ED Medications  levETIRAcetam (KEPPRA) tablet 1,000 mg (has no administration in time range)    ED Course/ Medical Decision Making/ A&P                                 Medical Decision Making Amount and/or Complexity of Data Reviewed Labs: ordered.   John Krueger 32 y.o. presented today for seizure-like activity. Working DDx that I considered at this time includes, but not limited to, seizure, PNES, non-convulsive status epilepticus, status epilepticus, hypoglycemia, electrolyte abnormalities, arrhythmias, sepsis, meningitis, medication non-compliance, drug intoxication/withdrawal, intracranial tumor/hemorrhage, subdural/epidural hematoma, basilar skull fracture, CVA/TIA.  R/o DDx: PNES, non-convulsive status epilepticus, status epilepticus, hypoglycemia, electrolyte abnormalities, arrhythmias, sepsis, meningitis, medication non-compliance, drug intoxication/withdrawal, intracranial tumor/hemorrhage, subdural/epidural hematoma, basilar skull fracture, CVA/TIA: These are considered less likely due to history of present illness and physical exam findings  Review of prior external notes: 02/16/2023 discharge summary  Unique Tests and My  Interpretation:  CBC: Unremarkable CMP: Unremarkable Levetiracetam: Pending Depakote: Pending  Discussion with Independent Historian: None  Discussion of Management of Tests:  Evette Georges, MD  Risk: Medium: prescription drug management  Risk Stratification Score: none  Staffed with Pollina, MD  Plan: Patient presented after seizure-like activity.  On exam patient was no acute distress and back to mental baseline. Patient did not have any lateral tongue biting, urinary incontinence, neuro deficit, track marks, rashes. Patient did not endorse new medication changes or illicit drug use. Patient is on Keppra and Depakote for seizures and stated they last took their meds earlier this evening and have been compliant. Patient's symptoms  lasted for unknown amount of time as this was not witnessed. Patient not currently endorsing symptoms and on exam does not appear in status epilepticus. Will proceed with labs.  I consulted with the attending physician and we agreed that patient did not need a larger workup as this appears to be another episode of patient's chronic seizures according to the patient.  Upon chart review it is unclear if patient is taking his medications due to social issues and so we will give 1 more dose of patient's Keppra here in the ED.  Patient's primary care provider was notified of ER visit and patient was encouraged to follow-up with [facility] neurology has seen them in the past.  I encouraged patient to continue his medications as prescribed.  Keppra and Depakote levels were obtained for future neurology visit.  Patient was given return precautions. Patient stable for discharge at this time.  Patient verbalized understanding of plan.        Final Clinical Impression(s) / ED Diagnoses Final diagnoses:  Seizure-like activity (HCC)    Rx / DC Orders ED Discharge Orders          Ordered    Ambulatory referral to Neurology       Comments: An appointment is requested  in approximately: 1 week   06/22/23 2319              Netta Corrigan, PA-C 06/22/23 2327    Gilda Crease, MD 06/23/23 [phone]

## 2023-06-22 NOTE — Discharge Instructions (Signed)
Please follow-up with your primary care provider and the neurologist I have attached your for you in regards to recent symptoms and ER visit.  Today your labs are reassuring and your Keppra and Depakote levels should result in epic over the next few days.  Please continue to use your medications as prescribed and if symptoms change or worsen please return to ER.

## 2023-06-22 NOTE — ED Triage Notes (Signed)
Patient reports he had a seizure this morning.  Patient reports being compliant with seizure medication.  Patient CAOx4 in triage.

## 2023-06-22 NOTE — ED Notes (Signed)
Patient verbalizes understanding of discharge instructions. Opportunity for questioning and answers were provided. Armband removed by staff, pt discharged from ED. Pt ambulatory to ED waiting room with steady gait.  

## 2023-06-23 LAB — VALPROIC ACID LEVEL: Valproic Acid Lvl: <10 ug/mL — ABNORMAL LOW (ref 50.0–100.0)

## 2023-06-24 LAB — LEVETIRACETAM LEVEL: Levetiracetam Lvl: <2 ug/mL — ABNORMAL LOW (ref 10.0–40.0)

## 2023-06-30 ENCOUNTER — Ambulatory Visit: Payer: Medicaid Other | Admitting: Neurology

## 2023-06-30 ENCOUNTER — Encounter: Payer: Self-pay | Admitting: Neurology

## 2023-07-07 ENCOUNTER — Other Ambulatory Visit: Payer: Self-pay | Admitting: Family Medicine

## 2023-07-14 ENCOUNTER — Inpatient Hospital Stay: Payer: Medicaid Other | Admitting: Family Medicine

## 2023-08-03 ENCOUNTER — Ambulatory Visit: Payer: Medicaid Other | Admitting: Family Medicine

## 2023-08-03 ENCOUNTER — Encounter: Payer: Self-pay | Admitting: Family Medicine

## 2023-08-03 VITALS — BP 128/89 | HR 100 | Temp 98.8°F | Ht 66.0 in | Wt 160.0 lb

## 2023-08-03 DIAGNOSIS — R110 Nausea: Secondary | ICD-10-CM | POA: Diagnosis present

## 2023-08-03 LAB — GLUCOSE, POCT (MANUAL RESULT ENTRY): POC Glucose: 78 mg/dL (ref 70–99)

## 2023-08-03 MED ORDER — ONDANSETRON HCL 4 MG PO TABS: 4.0000 mg | ORAL_TABLET | Freq: Three times a day (TID) | ORAL | 0 refills | Status: DC | PRN

## 2023-08-03 MED ORDER — ONDANSETRON 4 MG PO TBDP
4.0000 mg | ORAL_TABLET | Freq: Once | ORAL | Status: AC
Start: 2023-08-03 — End: 2023-08-03
  Administered 2023-08-03: 4 mg via ORAL

## 2023-08-03 NOTE — Patient Instructions (Signed)
Take the zofran I have sent in every 8 hours as needed for nausea and vomiting. You can also take tylenol according to the bottle. Max dose 1000 mg every 6 hours. If you are getting worse, not eating much, drinking much, or do not have much urine production, I would recommend going to the emergency room.

## 2023-08-03 NOTE — Progress Notes (Signed)
    SUBJECTIVE:   CHIEF COMPLAINT / HPI:   ED follow up Seen 8/26 for SLA. Felt sugar may have been low given not ate breakfast. Neuro exam normal at that time but BP markedly elevated to 169/116. He had been compliant with keppra at that time. Of note, he missed his appointment with neurology recently.  Today, he is having the same symptoms as he did when he went to the ED. He is nauseous and has vomited 4 times. It is liquid but is unable to say what the quality is like. He also has pain in his neck and head like prior SLA episodes. He has photosensitivity.  PERTINENT  PMH / PSH: unknown disorder causing SLA/somatic symptoms  OBJECTIVE:   BP 128/89   Pulse 100   Temp 98.8 F (37.1 C) (Oral)   Ht 5\' 6"  (1.676 m)   Wt 160 lb (72.6 kg)   SpO2 99%   BMI 25.82 kg/m   General: Alert and oriented, uncomfortable appearing, looking down away from the light, intermittently jittery Skin: Warm, dry, and intact HEENT: NCAT, EOM grossly normal, midline nasal septum Cardiac: RRR, no m/r/g appreciated Respiratory: CTAB anteriorly, breathing and speaking comfortably on RA Abdominal: Soft, mildly tender to palpation throughout, nondistended, normoactive bowel sounds Extremities: Moves all extremities grossly equally Neurological: No gross focal deficit Psychiatric: Appropriate mood and affect   ASSESSMENT/PLAN:   Nausea and vomiting Could be secondary to dehydration/hunger (CBG reassuring though at 78), viral process, or psychosomatic etiology since symptoms similar to prior self-described SLA. Less likely intracranial process given symptoms are not new and recent CT head 05/04/23 unremarkable. Reassured by overall unremarkable RUQ [place] earlier on 02/14/23 when he also had abdominal pain other than hepatic steatosis.  Gave 1 dose Zofran 4 mg in office.  Will send in zofran 4 mg Q8H prn nausea and vomiting.  Advised to try to increase p.o. intake with this.  Advised to return to ED should symptoms  worsen or not be able to hold anything down.   Janeal Holmes, MD [facility]

## 2023-08-06 ENCOUNTER — Encounter: Payer: Self-pay | Admitting: Neurology

## 2023-08-06 ENCOUNTER — Ambulatory Visit: Payer: Medicaid Other | Admitting: Neurology

## 2023-08-11 ENCOUNTER — Telehealth: Payer: Self-pay | Admitting: Emergency Medicine

## 2023-08-11 NOTE — Telephone Encounter (Signed)
Patient has been dismissed from [facility] and all providers practicing at this clinic per our NO SHOW POLICY

## 2023-09-22 ENCOUNTER — Emergency Department (HOSPITAL_COMMUNITY)
Admission: EM | Admit: 2023-09-22 | Discharge: 2023-09-22 | Disposition: A | Discharge status: Home / Self Care | Payer: Medicaid Other | Attending: Emergency Medicine | Admitting: Emergency Medicine

## 2023-09-22 ENCOUNTER — Other Ambulatory Visit: Payer: Self-pay

## 2023-09-22 ENCOUNTER — Emergency Department (HOSPITAL_COMMUNITY): Payer: Medicaid Other

## 2023-09-22 DIAGNOSIS — M542 Cervicalgia: Secondary | ICD-10-CM | POA: Diagnosis not present

## 2023-09-22 DIAGNOSIS — R519 Headache, unspecified: Secondary | ICD-10-CM | POA: Insufficient documentation

## 2023-09-22 DIAGNOSIS — R569 Unspecified convulsions: Secondary | ICD-10-CM | POA: Diagnosis present

## 2023-09-22 DIAGNOSIS — W19XXXA Unspecified fall, initial encounter: Secondary | ICD-10-CM | POA: Diagnosis not present

## 2023-09-22 DIAGNOSIS — M545 Low back pain, unspecified: Secondary | ICD-10-CM | POA: Insufficient documentation

## 2023-09-22 LAB — CBC WITH DIFFERENTIAL/PLATELET
Abs Immature Granulocytes: 0.01 10*3/uL (ref 0.00–0.07)
Basophils Absolute: 0 10*3/uL (ref 0.0–0.1)
Basophils Relative: 1 %
Eosinophils Absolute: 0 10*3/uL (ref 0.0–0.5)
Eosinophils Relative: 1 %
HCT: 40.8 % (ref 39.0–52.0)
Hemoglobin: 14 g/dL (ref 13.0–17.0)
Immature Granulocytes: 0 %
Lymphocytes Relative: 42 %
Lymphs Abs: 1.9 10*3/uL (ref 0.7–4.0)
MCH: 32.4 pg (ref 26.0–34.0)
MCHC: 34.3 g/dL (ref 30.0–36.0)
MCV: 94.4 fL (ref 80.0–100.0)
Monocytes Absolute: 0.4 10*3/uL (ref 0.1–1.0)
Monocytes Relative: 9 %
Neutro Abs: 2.1 10*3/uL (ref 1.7–7.7)
Neutrophils Relative %: 47 %
Platelets: 108 10*3/uL — ABNORMAL LOW (ref 150–400)
RBC: 4.32 MIL/uL (ref 4.22–5.81)
RDW: 14.5 % (ref 11.5–15.5)
WBC: 4.5 10*3/uL (ref 4.0–10.5)
nRBC: 0 % (ref 0.0–0.2)

## 2023-09-22 LAB — CBG MONITORING, ED
Glucose-Capillary: 59 mg/dL — ABNORMAL LOW (ref 70–99)
Glucose-Capillary: 109 mg/dL — ABNORMAL HIGH (ref 70–99)

## 2023-09-22 LAB — COMPREHENSIVE METABOLIC PANEL
ALT: 24 U/L (ref 0–44)
AST: 80 U/L — ABNORMAL HIGH (ref 15–41)
Albumin: 3.6 g/dL (ref 3.5–5.0)
Alkaline Phosphatase: 72 U/L (ref 38–126)
Anion gap: 17 — ABNORMAL HIGH (ref 5–15)
BUN: 5 mg/dL — ABNORMAL LOW (ref 6–20)
CO2: 20 mmol/L — ABNORMAL LOW (ref 22–32)
Calcium: 9.3 mg/dL (ref 8.9–10.3)
Chloride: 98 mmol/L (ref 98–111)
Creatinine, Ser: 0.96 mg/dL (ref 0.61–1.24)
GFR, Estimated: >60 mL/min (ref >60)
Glucose, Bld: 65 mg/dL — ABNORMAL LOW (ref 70–99)
Potassium: 4.4 mmol/L (ref 3.5–5.1)
Sodium: 135 mmol/L (ref 135–145)
Total Bilirubin: 1.5 mg/dL — ABNORMAL HIGH (ref <1.2)
Total Protein: 6.7 g/dL (ref 6.5–8.1)

## 2023-09-22 LAB — VALPROIC ACID LEVEL: Valproic Acid Lvl: <10 ug/mL — ABNORMAL LOW (ref 50.0–100.0)

## 2023-09-22 LAB — MAGNESIUM: Magnesium: 1.6 mg/dL — ABNORMAL LOW (ref 1.7–2.4)

## 2023-09-22 MED ORDER — DIAZEPAM 5 MG/ML IJ SOLN: 5.0000 mg | Freq: Once | INTRAMUSCULAR | Status: DC

## 2023-09-22 MED ORDER — ACETAMINOPHEN 325 MG PO TABS
650.0000 mg | ORAL_TABLET | Freq: Once | ORAL | Status: AC
  Administered 2023-09-22: 650 mg via ORAL
  Filled 2023-09-22: qty 2

## 2023-09-22 MED ORDER — LEVETIRACETAM 750 MG PO TABS: 1500.0000 mg | ORAL_TABLET | Freq: Two times a day (BID) | ORAL | 2 refills | Status: AC

## 2023-09-22 MED ORDER — KETOROLAC TROMETHAMINE 15 MG/ML IJ SOLN
15.0000 mg | Freq: Once | INTRAMUSCULAR | Status: AC
  Administered 2023-09-22: 15 mg via INTRAVENOUS
  Filled 2023-09-22: qty 1

## 2023-09-22 MED ORDER — DIAZEPAM 5 MG/ML IJ SOLN
2.5000 mg | Freq: Once | INTRAMUSCULAR | Status: AC
  Administered 2023-09-22: 2.5 mg via INTRAVENOUS
  Filled 2023-09-22: qty 2

## 2023-09-22 MED ORDER — LEVETIRACETAM IN NACL 1000 MG/100ML IV SOLN
1000.0000 mg | Freq: Once | INTRAVENOUS | Status: AC
  Administered 2023-09-22: 1000 mg via INTRAVENOUS
  Filled 2023-09-22: qty 100

## 2023-09-22 MED ORDER — MAGNESIUM SULFATE 2 GM/50ML IV SOLN
2.0000 g | Freq: Once | INTRAVENOUS | Status: AC
  Administered 2023-09-22: 2 g via INTRAVENOUS
  Filled 2023-09-22: qty 50

## 2023-09-22 MED ORDER — LACTATED RINGERS IV BOLUS
1000.0000 mL | Freq: Once | INTRAVENOUS | Status: AC
  Administered 2023-09-22: 1000 mL via INTRAVENOUS

## 2023-09-22 NOTE — ED Triage Notes (Signed)
Pt. Stated, I had a seizure yesterday around 600pm and I fall, my right leg always hurts, my neck and head. I took my seizure medicine.

## 2023-09-22 NOTE — ED Provider Notes (Signed)
EMERGENCY DEPARTMENT AT [facility] Provider Note   CSN: [id number] Arrival date & time: 09/22/23  0920     History  Chief Complaint  Patient presents with   Seizures   Neck Pain   Fall   Headache    John Krueger is a 32 y.o. male.  HPI Patient presents for neck pain, posterior headache, and right hemibody discomfort.  Medical history includes seizures, alcohol use, anemia.  He states that he will typically have 1-2 seizures per week.  He has a prodrome of these symptoms of neck pain, posterior head pain, and pain in his back prior to seizure episodes.  Pain will typically worsen after his seizures.  He states that he has been adherent to his Keppra.  He denies any alcohol use.  He feels that the triggers for his breakthrough seizures are poor sleep.  He has had poor sleep lately.  He states that he had a seizure last night.  Since his seizure last night, he has had his typical postseizure pain, which has continued this morning.  For this reason, he presents to the ED.  He has not taken anything for his pain.  Per chart review, he was seen in August following a seizure-like episode.  At the time, it seems that he was on Keppra and Depakote.  Patient is unclear of which medications he is taking but does feel confident that he is taking his seizure medications.    Home Medications Prior to Admission medications   Medication Sig Start Date End Date Taking? Authorizing Provider  B Complex-C (B-COMPLEX WITH VITAMIN C) tablet Take 1 tablet by mouth daily. 02/17/23   Carrion-Carrero, Karle Starch, MD  folic acid (FOLVITE) 1 MG tablet TAKE 1 TABLET BY MOUTH DAILY 07/07/23   Evette Georges, MD  gabapentin (NEURONTIN) 100 MG capsule Take 1 capsule (100 mg total) by mouth 3 (three) times daily. 05/23/23 06/22/23  Evette Georges, MD  levETIRAcetam (KEPPRA) 750 MG tablet Take 2 tablets (1,500 mg total) by mouth 2 (two) times daily. 09/22/23 12/21/23  Gloris Manchester, MD  Multiple Vitamin  (MULTIVITAMIN WITH MINERALS) TABS tablet Take 1 tablet by mouth daily. 02/17/23   Carrion-Carrero, Karle Starch, MD  ondansetron (ZOFRAN) 4 MG tablet Take 1 tablet (4 mg total) by mouth every 8 (eight) hours as needed for nausea or vomiting. 08/03/23   Evette Georges, MD  thiamine (VITAMIN B-1) 100 MG tablet Take 1 tablet (100 mg total) by mouth daily. 05/23/23   Evette Georges, MD  Vitamin D, Ergocalciferol, (DRISDOL) 1.25 MG (50000 UNIT) CAPS capsule Take 1 capsule (50,000 Units total) by mouth every 7 (seven) days. 02/22/23   Carrion-Carrero, Karle Starch, MD      Allergies    Patient has no known allergies.    Review of Systems   Review of Systems  Musculoskeletal:  Positive for myalgias and neck pain.  Neurological:  Positive for seizures and headaches.  All other systems reviewed and are negative.   Physical Exam Updated Vital Signs BP (!) 155/101 (BP Location: Right Arm)   Pulse (!) 59   Temp 98.7 F (37.1 C) (Oral)   Resp 18   Ht 5\' 6"  (1.676 m)   SpO2 100%   BMI 25.82 kg/m  Physical Exam Vitals and nursing note reviewed.  Constitutional:      General: He is not in acute distress.    Appearance: He is well-developed. He is not ill-appearing, toxic-appearing or diaphoretic.  HENT:     Head: Normocephalic  and atraumatic.     Mouth/Throat:     Mouth: Mucous membranes are moist.  Eyes:     Extraocular Movements: Extraocular movements intact.     Conjunctiva/sclera: Conjunctivae normal.  Cardiovascular:     Rate and Rhythm: Normal rate and regular rhythm.     Heart sounds: No murmur heard. Pulmonary:     Effort: Pulmonary effort is normal. No respiratory distress.     Breath sounds: Normal breath sounds. No wheezing or rales.  Abdominal:     General: There is no distension.     Palpations: Abdomen is soft.     Tenderness: There is no abdominal tenderness.  Musculoskeletal:        General: No swelling.     Cervical back: Neck supple.  Skin:    General: Skin is warm and dry.   Neurological:     Mental Status: He is alert and oriented to person, place, and time.     Cranial Nerves: No cranial nerve deficit, dysarthria or facial asymmetry.     Sensory: No sensory deficit.     Motor: No weakness.     Coordination: Coordination normal.  Psychiatric:        Mood and Affect: Mood normal.        Speech: Speech normal.        Behavior: Behavior normal.     ED Results / Procedures / Treatments   Labs (all labs ordered are listed, but only abnormal results are displayed) Labs Reviewed  COMPREHENSIVE METABOLIC PANEL - Abnormal; Notable for the following components:      Result Value   CO2 20 (*)    Glucose, Bld 65 (*)    BUN 5 (*)    AST 80 (*)    Total Bilirubin 1.5 (*)    Anion gap 17 (*)    All other components within normal limits  CBC WITH DIFFERENTIAL/PLATELET - Abnormal; Notable for the following components:   Platelets 108 (*)    All other components within normal limits  MAGNESIUM - Abnormal; Notable for the following components:   Magnesium 1.6 (*)    All other components within normal limits  VALPROIC ACID LEVEL - Abnormal; Notable for the following components:   Valproic Acid Lvl <10 (*)    All other components within normal limits  CBG MONITORING, ED - Abnormal; Notable for the following components:   Glucose-Capillary 59 (*)    All other components within normal limits  CBG MONITORING, ED - Abnormal; Notable for the following components:   Glucose-Capillary 109 (*)    All other components within normal limits  URINALYSIS, ROUTINE W REFLEX MICROSCOPIC  LEVETIRACETAM LEVEL    EKG EKG Interpretation Date/Time:  Tuesday September 22 2023 10:04:04 EST Ventricular Rate:  76 PR Interval:    QRS Duration:  82 QT Interval:  375 QTC Calculation: 422 R Axis:   32  Text Interpretation: Sinus rhythm Nonspecific T abnormalities, inferior leads Confirmed by Gloris Manchester ([id number]) on 09/22/2023 10:43:10 AM  Radiology CT HEAD WO CONTRAST  Result  Date: 09/22/2023 CLINICAL DATA:  Syncope/presyncope, cerebrovascular cause suspected. Seizure yesterday resulting in a fall. EXAM: CT HEAD WITHOUT CONTRAST TECHNIQUE: Contiguous axial images were obtained from the base of the skull through the vertex without intravenous contrast. RADIATION DOSE REDUCTION: This exam was performed according to the departmental dose-optimization program which includes automated exposure control, adjustment of the mA and/or kV according to patient size and/or use of iterative reconstruction technique. COMPARISON:  Head CT  05/04/2023 and MRI 02/14/2023 FINDINGS: Brain: There is no evidence of an acute infarct, intracranial hemorrhage, mass, midline shift, or extra-axial fluid collection. Age advanced cerebral and cerebellar atrophy is again noted. Vascular: No hyperdense vessel. Skull: No acute fracture or suspicious osseous lesion. Sinuses/Orbits: Visualized paranasal sinuses and mastoid air cells are clear. Unremarkable orbits. Other: None. IMPRESSION: No evidence of acute intracranial abnormality. Electronically Signed   By: Sebastian Ache M.D.   On: 09/22/2023 12:03    Procedures Procedures    Medications Ordered in ED Medications  magnesium sulfate IVPB 2 g 50 mL (2 g Intravenous New Bag/Given 09/22/23 1329)  lactated ringers bolus 1,000 mL (0 mLs Intravenous Stopped 09/22/23 1204)  levETIRAcetam (KEPPRA) IVPB 1000 mg/100 mL premix (0 mg Intravenous Stopped 09/22/23 1103)  acetaminophen (TYLENOL) tablet 650 mg (650 mg Oral Given 09/22/23 1036)  diazepam (VALIUM) injection 2.5 mg (2.5 mg Intravenous Given 09/22/23 1212)  ketorolac (TORADOL) 15 MG/ML injection 15 mg (15 mg Intravenous Given 09/22/23 1325)    ED Course/ Medical Decision Making/ A&P                                 Medical Decision Making Amount and/or Complexity of Data Reviewed Labs: ordered. Radiology: ordered.  Risk OTC drugs. Prescription drug management.   This patient presents to the  ED for concern of seizure, this involves an extensive number of treatment options, and is a complaint that carries with it a high risk of complications and morbidity.  The differential diagnosis includes medication nonadherence, infection, metabolic arrangements, withdrawal, need for medication adjustment   Co morbidities that complicate the patient evaluation  Seizures   Additional history obtained:  Additional history obtained from N/A External records from outside source obtained and reviewed including EMR   Lab Tests:  I Ordered, and personally interpreted labs.  The pertinent results include: Normal hemoglobin, no leukocytosis, normal kidney function, hypomagnesemia with otherwise normal electrolytes, low-normal blood glucose   Imaging Studies ordered:  I ordered imaging studies including CT head I independently visualized and interpreted imaging which showed no acute findings I agree with the radiologist interpretation   Cardiac Monitoring: / EKG:  The patient was maintained on a cardiac monitor.  I personally viewed and interpreted the cardiac monitored which showed an underlying rhythm of: Sinus rhythm   Consultations Obtained:  I requested consultation with the neurologist, Dr. Wilford Corner,  and discussed lab and imaging findings as well as pertinent plan - they recommend: Increase Keppra to 1500 twice daily, patient to follow-up with outpatient neurology   Problem List / ED Course / Critical interventions / Medication management  Patient presents for areas of pain and neck, posterior head, right hemibody, which she states is typical after he has a seizure.  He does state that he had a seizure last night.  He endorses breakthrough seizures 1-2 times per week.  He feels that the trigger of these is for sleep, which she has had lately.  On arrival in the ED, he is well-appearing.  He is alert and oriented.  He has no focal deficits on exam, however, he does have some mild  weakness in bilateral legs.  Given his well appearance and stating that his current symptoms are typical following a seizure, I do not suspect meningitis.  CBG today showed hypoglycemia.  He was given ginger ale.  Per chart review, he was seen in July for nausea, headache, and malaise.  This was also after suspected seizure.  He was hypoglycemic at the time.  Of note, he has been told that he may have diabetes but he is not on any diabetic medications.  He was subsequently seen in August following a seizure-like activity.  Patient to undergo workup and observation here in the ED.  Dose of Keppra was given.  Per chart review, when patient was seen in August for breakthrough seizure, he also stated that he was adherent to his home Keppra.  Keppra level of the time was undetectable.  The seizures that he describes may be secondary to poor adherence to his prescribed medication.  Patient's lab work was notable for hypomagnesemia.  Replacement magnesium was ordered.  CT head did not show acute findings.  Toradol and Valium were ordered for further analgesia.  I spoke with neurologist on-call, Dr. Wilford Corner, who recommends increasing Keppra to 1500 mg twice daily.  Prescription was provided.  Patient has not yet followed up with outpatient neurology.  The importance of this was stressed to him.  He states that he has a difficult time finding a ride.  He was advised to reach out to the office for a possible telehealth visit if nothing else.  Patient had resolved symptoms while in the ED.  He was discharged in stable condition.   I ordered medication including Keppra for seizure prophylaxis; Tylenol, Toradol, and Valium for analgesia; magnesium sulfate for hypomagnesemia; IV fluids for hydration Reevaluation of the patient after these medicines showed that the patient improved I have reviewed the patients home medicines and have made adjustments as needed   Social Determinants of Health:  History of medication  nonadherence        Final Clinical Impression(s) / ED Diagnoses Final diagnoses:  Seizure (HCC)    Rx / DC Orders ED Discharge Orders          Ordered    levETIRAcetam (KEPPRA) 750 MG tablet  2 times daily        09/22/23 1357              Gloris Manchester, MD 09/22/23 1358

## 2023-09-22 NOTE — Discharge Instructions (Signed)
Increase your Keppra dose to 1500 mg twice a day.  A new prescription has been sent to your pharmacy.  Call the telephone number below to set up a follow-up appointment with the neurologist.  They will be able to manage your medications going forward.  Per [facility] statutes, patients with seizures are not allowed to drive until  they have been seizure-free for six months. Use caution when using heavy equipment or power tools. Avoid working on ladders or at heights. Take showers instead of baths. Ensure the water temperature is not too high on the home water heater. Do not go swimming alone. When caring for infants or small children, sit down when holding, feeding, or changing them to minimize risk of injury to the child in the event you have a seizure.   Also, Maintain good sleep hygiene. Avoid alcohol.

## 2023-09-22 NOTE — ED Notes (Signed)
SZ pads placed.

## 2023-09-23 LAB — LEVETIRACETAM LEVEL: Levetiracetam Lvl: 10.7 ug/mL (ref 10.0–40.0)

## 2023-10-15 ENCOUNTER — Other Ambulatory Visit: Payer: Self-pay

## 2023-10-15 NOTE — Patient Outreach (Signed)
  Medicaid Managed Care Social Work Note  10/15/2023 Name:  John Krueger MRN:  [id number] DOB:  05-19-1991  John Krueger is an 32 y.o. year old male who is a primary patient of Evette Georges, MD.  The Medicaid Managed Care Coordination team was consulted for assistance with:  Community Resources   [name] was given information about Medicaid Managed Care Coordination team services today. John Krueger  sister agreed to services and verbal consent obtained.  Engaged with patient  for by telephone forinitial visit in response to referral for case management and/or care coordination services.   Patient is participating in a Managed Medicaid Plan:  Yes  Assessments/Interventions:  Review of past medical history, allergies, medications, health status, including review of consultants reports, laboratory and other test data, was performed as part of comprehensive evaluation and provision of chronic care management services.  SDOH: (Social Drivers of Health) assessments and interventions performed:  BSW completed a telephone outreach with patients sister. She states patient has not worked due to his seizures. Patient is currently living with mother and she also has the same seizures. Sister reports patient has applied for disability and currently working with a Clinical research associate. They do receive foodstamps and have been receiving assistance with utilities and mortgage from [employer]. BSW and sister agreed for resources to be emailed to her at [email or web address]  Advanced Directives Status:  Not addressed in this encounter.  Care Plan                 No Known Allergies  Medications Reviewed Today   Medications were not reviewed in this encounter     Patient Active Problem List   Diagnosis Date Noted   Vitamin D deficiency 03/19/2023   Ambulatory dysfunction 02/25/2023   Vision loss 02/25/2023   Seizure-like activity (HCC) 02/15/2023   Rash and nonspecific skin eruption 02/15/2023   Anemia 02/15/2023    Other skin changes 02/14/2023   GI bleeding 02/13/2023   Alcohol use 02/13/2023   Tobacco use 02/13/2023   Seizure disorder (HCC) 01/15/2023   Chronic pain 01/15/2023   Dental decay 01/15/2023    Conditions to be addressed/monitored per PCP order:   community resources  There are no care plans that you recently modified to display for this patient.   Follow up:  Patient agrees to Care Plan and Follow-up.  Plan: The Managed Medicaid care management team will reach out to the patient again over the next 30 days.  Date/time of next scheduled Social Work care management/care coordination outreach:  11/16/23  Gus Puma, Kenard Gower, [facility]  Managed [facility] Social Worker [phone]

## 2023-10-15 NOTE — Patient Instructions (Signed)
Visit Information  Mr. Furukawa was given information about Medicaid Managed Care team care coordination services as a part of their [facility] Community Plan Medicaid benefit. [name] verbally consented to engagement with the [facility] Managed Care team.   If you are experiencing a medical emergency, please call 911 or report to your local emergency department or urgent care.   If you have a non-emergency medical problem during routine business hours, please contact your provider's office and ask to speak with a nurse.   For questions related to your [facility] please call: [phone] or visit the homepage here: [email or web address]  If you would like to schedule transportation through your [facility], please call the following number at least 2 days in advance of your appointment: [phone]   Rides for urgent appointments can also be made after hours by calling Member Services.  Call the Behavioral Health Crisis Line at [phone], at any time, 24 hours a day, 7 days a week. If you are in danger or need immediate medical attention call 911.  If you would like help to quit smoking, call 1-800-QUIT-NOW ((682)116-0243) OR Espaol: 1-855-Djelo-Ya (4-166-063-0160) o para ms informacin haga clic aqu or Text READY to 109-323 to register via text  Mr. Mischel - following are the goals we discussed in your visit today:   Goals Addressed   None     Social Worker will follow up in 30 days.   Gus Puma, Kenard Gower, MHA [facility]  Managed Medicaid Social Worker [phone]   Following is a copy of your plan of care:  There are no care plans that you recently modified to display for this patient.

## 2023-11-16 ENCOUNTER — Other Ambulatory Visit: Payer: Self-pay

## 2023-11-16 NOTE — Patient Outreach (Signed)
  Medicaid Managed Care Social Work Note  11/16/2023 Name:  John Krueger MRN:  [id number] DOB:  [birth year]  John Krueger is an 33 y.o. year old male who is a primary patient of Evette Georges, MD.  The Medicaid Managed Care Coordination team was consulted for assistance with:  Community Resources   Mr. Pelfrey was given information about Medicaid Managed Care Coordination team services today. [name]  sister agreed to services and verbal consent obtained.  Engaged with patient  for by telephone forfollow up visit in response to referral for case management and/or care coordination services.   Patient is participating in a Managed Medicaid Plan:  Yes  Assessments/Interventions:  Review of past medical history, allergies, medications, health status, including review of consultants reports, laboratory and other test data, was performed as part of comprehensive evaluation and provision of chronic care management services.  SDOH: (Social Drivers of Health) assessments and interventions performed: BSW completed a telephone outreach with patients sister, she states she did receive the resources BSW sent on 12/19. She states they are waiting on [employer] to pay the mortgage and utilities for the month. BSW encouraged for them to try the resources sent. Sister asked if resources could be resent. Resources emailed again. No other resources are needed at this time.  Advanced Directives Status:  Not addressed in this encounter.  Care Plan                 No Known Allergies  Medications Reviewed Today   Medications were not reviewed in this encounter     Patient Active Problem List   Diagnosis Date Noted   Vitamin D deficiency 03/19/2023   Ambulatory dysfunction 02/25/2023   Vision loss 02/25/2023   Seizure-like activity (HCC) 02/15/2023   Rash and nonspecific skin eruption 02/15/2023   Anemia 02/15/2023   Other skin changes 02/14/2023   GI bleeding 02/13/2023   Alcohol use 02/13/2023    Tobacco use 02/13/2023   Seizure disorder (HCC) 01/15/2023   Chronic pain 01/15/2023   Dental decay 01/15/2023    Conditions to be addressed/monitored per PCP order:   community resources   There are no care plans that you recently modified to display for this patient.   Follow up:  Patient agrees to Care Plan and Follow-up.  Plan: The  sister has been provided with contact information for the Managed Medicaid care management team and has been advised to call with any health related questions or concerns.    Abelino Derrick, MHA [facility]  Managed [facility] Social Worker [phone]

## 2023-11-16 NOTE — Patient Instructions (Signed)
Visit Information  John Krueger was given information about Medicaid Managed Care team care coordination services as a part of their [facility] Community Plan Medicaid benefit. John Krueger verbally consented to engagement with the [facility] Managed Care team.   If you are experiencing a medical emergency, please call 911 or report to your local emergency department or urgent care.   If you have a non-emergency medical problem during routine business hours, please contact your provider's office and ask to speak with a nurse.   For questions related to your [facility], please call: [phone] or visit the homepage here: [email or web address]  If you would like to schedule transportation through your [facility], please call the following number at least 2 days in advance of your appointment: [phone]   Rides for urgent appointments can also be made after hours by calling Member Services.  Call the Behavioral Health Crisis Line at [phone], at any time, 24 hours a day, 7 days a week. If you are in danger or need immediate medical attention call 911.  If you would like help to quit smoking, call 1-800-QUIT-NOW (601 004 0712) OR Espaol: 1-855-Djelo-Ya (4-403-474-2595) o para ms informacin haga clic aqu or Text READY to 638-756 to register via text  John Krueger - following are the goals we discussed in your visit today:   Goals Addressed   None    The  sister has been provided with contact information for the Managed Medicaid care management team and has been advised to call with any health related questions or concerns.   Gus Puma, Kenard Gower, MHA [facility]  Managed Medicaid Social Worker [phone]   Following is a copy of your plan of care:  There are no care plans that you recently modified to display for this patient.

## 2023-11-25 ENCOUNTER — Ambulatory Visit: Payer: PRIVATE HEALTH INSURANCE | Admitting: Neurology

## 2023-11-25 ENCOUNTER — Encounter: Payer: Self-pay | Admitting: Neurology

## 2023-12-15 ENCOUNTER — Emergency Department (HOSPITAL_COMMUNITY)
Admission: EM | Admit: 2023-12-15 | Discharge: 2023-12-16 | Disposition: A | Discharge status: Home / Self Care | Payer: Medicaid Other | Attending: Student | Admitting: Student

## 2023-12-15 ENCOUNTER — Emergency Department (HOSPITAL_COMMUNITY): Payer: Medicaid Other

## 2023-12-15 DIAGNOSIS — F17210 Nicotine dependence, cigarettes, uncomplicated: Secondary | ICD-10-CM | POA: Insufficient documentation

## 2023-12-15 DIAGNOSIS — M542 Cervicalgia: Secondary | ICD-10-CM | POA: Insufficient documentation

## 2023-12-15 DIAGNOSIS — E876 Hypokalemia: Secondary | ICD-10-CM | POA: Diagnosis not present

## 2023-12-15 DIAGNOSIS — M549 Dorsalgia, unspecified: Secondary | ICD-10-CM

## 2023-12-15 LAB — CBC
HCT: 39.1 % (ref 39.0–52.0)
Hemoglobin: 13.6 g/dL (ref 13.0–17.0)
MCH: 31.9 pg (ref 26.0–34.0)
MCHC: 34.8 g/dL (ref 30.0–36.0)
MCV: 91.8 fL (ref 80.0–100.0)
Platelets: 158 10*3/uL (ref 150–400)
RBC: 4.26 MIL/uL (ref 4.22–5.81)
RDW: 14.5 % (ref 11.5–15.5)
WBC: 5.8 10*3/uL (ref 4.0–10.5)
nRBC: 0 % (ref 0.0–0.2)

## 2023-12-15 LAB — BASIC METABOLIC PANEL
Anion gap: 14 (ref 5–15)
BUN: 5 mg/dL — ABNORMAL LOW (ref 6–20)
CO2: 22 mmol/L (ref 22–32)
Calcium: 8.4 mg/dL — ABNORMAL LOW (ref 8.9–10.3)
Chloride: 102 mmol/L (ref 98–111)
Creatinine, Ser: 0.94 mg/dL (ref 0.61–1.24)
GFR, Estimated: >60 mL/min (ref >60)
Glucose, Bld: 88 mg/dL (ref 70–99)
Potassium: 3.2 mmol/L — ABNORMAL LOW (ref 3.5–5.1)
Sodium: 138 mmol/L (ref 135–145)

## 2023-12-15 NOTE — ED Triage Notes (Signed)
Pt to ED c/o seizure, reports had seizure Sunday morning, and has had neck and pain ever since. History of the same. Reports compliant with medication regimen. Ambulatory in triage

## 2023-12-15 NOTE — ED Provider Triage Note (Signed)
Emergency Medicine Provider Triage Evaluation Note  John Krueger , a 33 y.o. male  was evaluated in triage.  Pt complains of head neck and back pain.  States he believes he had a seizure on Sunday and may have hit the back of his head and his back.  Now pain in the posterior midline of his neck and seems to radiate all the way down his back.  States he still able to ambulate but has been very difficult.  Every time he takes a step he can feel pain in his mid back.  States he is compliant with taking his Keppra.   Review of Systems  Positive: See above Negative: See above  Physical Exam  BP (!) 102/58 (BP Location: Right Arm)   Pulse 98   Temp 97.9 F (36.6 C) (Oral)   Resp 17  Gen:   Awake, no distress   Resp:  Normal effort  MSK:   Moves extremities without difficulty  Other:    Medical Decision Making  Medically screening exam initiated at 7:10 PM.  Appropriate orders placed.  John Krueger was informed that the remainder of the evaluation will be completed by another provider, this initial triage assessment does not replace that evaluation, and the importance of remaining in the ED until their evaluation is complete.  Work up started   John Krueger, [place] 12/15/23 1911

## 2023-12-16 MED ORDER — NAPROXEN 375 MG PO TABS
375.0000 mg | ORAL_TABLET | Freq: Two times a day (BID) | ORAL | 0 refills | Status: DC
Start: 2023-12-16 — End: 2024-04-05

## 2023-12-16 MED ORDER — KETOROLAC TROMETHAMINE 15 MG/ML IJ SOLN
15.0000 mg | Freq: Once | INTRAMUSCULAR | Status: AC
  Administered 2023-12-16: 15 mg via INTRAMUSCULAR
  Filled 2023-12-16: qty 1

## 2023-12-16 MED ORDER — DIAZEPAM 5 MG PO TABS
5.0000 mg | ORAL_TABLET | Freq: Once | ORAL | Status: AC
  Administered 2023-12-16: 5 mg via ORAL
  Filled 2023-12-16: qty 1
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
## 2023-12-16 NOTE — ED Notes (Signed)
Patient discharged by this RN. No additional questions for RN, ambulatory to lobby at discharge.

## 2024-04-05 ENCOUNTER — Ambulatory Visit (HOSPITAL_COMMUNITY)

## 2024-04-05 ENCOUNTER — Ambulatory Visit (HOSPITAL_COMMUNITY)
Admission: EM | Admit: 2024-04-05 | Discharge: 2024-04-05 | Disposition: A | Discharge status: Home / Self Care | Attending: Family Medicine | Admitting: Family Medicine

## 2024-04-05 ENCOUNTER — Ambulatory Visit (INDEPENDENT_AMBULATORY_CARE_PROVIDER_SITE_OTHER)

## 2024-04-05 ENCOUNTER — Encounter (HOSPITAL_COMMUNITY): Payer: Self-pay

## 2024-04-05 DIAGNOSIS — M542 Cervicalgia: Secondary | ICD-10-CM

## 2024-04-05 DIAGNOSIS — M25522 Pain in left elbow: Secondary | ICD-10-CM

## 2024-04-05 DIAGNOSIS — M25562 Pain in left knee: Secondary | ICD-10-CM | POA: Diagnosis not present

## 2024-04-05 DIAGNOSIS — R079 Chest pain, unspecified: Secondary | ICD-10-CM | POA: Diagnosis not present

## 2024-04-05 MED ORDER — HYDROCODONE-ACETAMINOPHEN 5-325 MG PO TABS
1.0000 | ORAL_TABLET | Freq: Once | ORAL | Status: AC
  Administered 2024-04-05: 1 via ORAL

## 2024-04-05 MED ORDER — HYDROCODONE-ACETAMINOPHEN 5-325 MG PO TABS
ORAL_TABLET | ORAL | Status: AC
  Filled 2024-04-05: qty 1

## 2024-04-05 MED ORDER — DICLOFENAC SODIUM 75 MG PO TBEC
75.0000 mg | DELAYED_RELEASE_TABLET | Freq: Two times a day (BID) | ORAL | 0 refills | Status: AC
Start: 2024-04-05 — End: ?

## 2024-04-05 MED ORDER — METHOCARBAMOL 1000 MG PO TABS: 1000.0000 mg | ORAL_TABLET | Freq: Three times a day (TID) | ORAL | 0 refills | Status: AC | PRN

## 2024-04-05 NOTE — Discharge Instructions (Signed)
 Meds ordered this encounter  Medications   HYDROcodone -acetaminophen  (NORCO/VICODIN) 5-325 MG per tablet 1 tablet    Refill:  0   Methocarbamol  1000 MG TABS    Sig: Take 1,000 mg by mouth every 8 (eight) hours as needed.    Dispense:  21 tablet    Refill:  0   diclofenac (VOLTAREN) 75 MG EC tablet    Sig: Take 1 tablet (75 mg total) by mouth 2 (two) times daily.    Dispense:  14 tablet    Refill:  0
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
## 2024-04-07 ENCOUNTER — Ambulatory Visit (HOSPITAL_COMMUNITY): Payer: Self-pay

## 2024-04-27 ENCOUNTER — Emergency Department (HOSPITAL_COMMUNITY)
Admission: EM | Admit: 2024-04-27 | Discharge: 2024-04-27 | Disposition: A | Discharge status: Home / Self Care | Attending: Emergency Medicine | Admitting: Emergency Medicine

## 2024-04-27 ENCOUNTER — Other Ambulatory Visit: Payer: Self-pay

## 2024-04-27 DIAGNOSIS — E876 Hypokalemia: Secondary | ICD-10-CM | POA: Diagnosis not present

## 2024-04-27 DIAGNOSIS — G40909 Epilepsy, unspecified, not intractable, without status epilepticus: Secondary | ICD-10-CM | POA: Insufficient documentation

## 2024-04-27 DIAGNOSIS — R569 Unspecified convulsions: Secondary | ICD-10-CM

## 2024-04-27 LAB — CBC WITH DIFFERENTIAL/PLATELET
Abs Immature Granulocytes: 0.01 10*3/uL (ref 0.00–0.07)
Basophils Absolute: 0 10*3/uL (ref 0.0–0.1)
Basophils Relative: 1 %
Eosinophils Absolute: 0 10*3/uL (ref 0.0–0.5)
Eosinophils Relative: 1 %
HCT: 43.7 % (ref 39.0–52.0)
Hemoglobin: 15.2 g/dL (ref 13.0–17.0)
Immature Granulocytes: 0 %
Lymphocytes Relative: 59 %
Lymphs Abs: 3.7 10*3/uL (ref 0.7–4.0)
MCH: 32.3 pg (ref 26.0–34.0)
MCHC: 34.8 g/dL (ref 30.0–36.0)
MCV: 92.8 fL (ref 80.0–100.0)
Monocytes Absolute: 0.6 10*3/uL (ref 0.1–1.0)
Monocytes Relative: 10 %
Neutro Abs: 1.8 10*3/uL (ref 1.7–7.7)
Neutrophils Relative %: 29 %
Platelets: 163 10*3/uL (ref 150–400)
RBC: 4.71 MIL/uL (ref 4.22–5.81)
RDW: 14.1 % (ref 11.5–15.5)
WBC: 6.1 10*3/uL (ref 4.0–10.5)
nRBC: 0.5 % — ABNORMAL HIGH (ref 0.0–0.2)

## 2024-04-27 LAB — COMPREHENSIVE METABOLIC PANEL WITH GFR
ALT: 22 U/L (ref 0–44)
AST: 41 U/L (ref 15–41)
Albumin: 2.9 g/dL — ABNORMAL LOW (ref 3.5–5.0)
Alkaline Phosphatase: 55 U/L (ref 38–126)
Anion gap: 15 (ref 5–15)
BUN: 6 mg/dL (ref 6–20)
CO2: 20 mmol/L — ABNORMAL LOW (ref 22–32)
Calcium: 8.2 mg/dL — ABNORMAL LOW (ref 8.9–10.3)
Chloride: 99 mmol/L (ref 98–111)
Creatinine, Ser: 0.82 mg/dL (ref 0.61–1.24)
GFR, Estimated: >60 mL/min (ref >60)
Glucose, Bld: 90 mg/dL (ref 70–99)
Potassium: 3.1 mmol/L — ABNORMAL LOW (ref 3.5–5.1)
Sodium: 134 mmol/L — ABNORMAL LOW (ref 135–145)
Total Bilirubin: 0.4 mg/dL (ref 0.0–1.2)
Total Protein: 5.3 g/dL — ABNORMAL LOW (ref 6.5–8.1)

## 2024-04-27 LAB — VALPROIC ACID LEVEL: Valproic Acid Lvl: <10 ug/mL — ABNORMAL LOW (ref 50–100)

## 2024-04-27 LAB — MAGNESIUM: Magnesium: 1.4 mg/dL — ABNORMAL LOW (ref 1.7–2.4)

## 2024-04-27 MED ORDER — KETOROLAC TROMETHAMINE 15 MG/ML IJ SOLN
15.0000 mg | Freq: Once | INTRAMUSCULAR | Status: AC
  Administered 2024-04-27: 15 mg via INTRAVENOUS
  Filled 2024-04-27: qty 1

## 2024-04-27 MED ORDER — POTASSIUM CHLORIDE CRYS ER 20 MEQ PO TBCR
40.0000 meq | EXTENDED_RELEASE_TABLET | Freq: Once | ORAL | Status: AC
  Administered 2024-04-27: 40 meq via ORAL
  Filled 2024-04-27: qty 2

## 2024-04-27 MED ORDER — LEVETIRACETAM (KEPPRA) 500 MG/5 ML ADULT IV PUSH
1000.0000 mg | Freq: Once | INTRAVENOUS | Status: AC
  Administered 2024-04-27: 1000 mg via INTRAVENOUS
  Filled 2024-04-27: qty 10

## 2024-04-27 MED ORDER — DIAZEPAM 5 MG/ML IJ SOLN
2.5000 mg | Freq: Once | INTRAMUSCULAR | Status: AC
  Administered 2024-04-27: 2.5 mg via INTRAVENOUS
  Filled 2024-04-27: qty 2

## 2024-04-27 MED ORDER — MAGNESIUM OXIDE -MG SUPPLEMENT 400 (240 MG) MG PO TABS
400.0000 mg | ORAL_TABLET | Freq: Once | ORAL | Status: AC
  Administered 2024-04-27: 400 mg via ORAL
  Filled 2024-04-27: qty 1

## 2024-04-27 NOTE — ED Notes (Signed)
 Phlebotomy consult placed for lab draw, IV unable to draw back.

## 2024-04-27 NOTE — Discharge Instructions (Signed)
 It was a pleasure taking care of you here today  Follow-up with your primary care provider  Return for any new or worsening symptoms

## 2024-04-27 NOTE — ED Triage Notes (Signed)
 Patient arrives via [facility] for right side pain. Patient endorses right side pain x 4 years, starting in neck and traveling down right side to feet. Patient had 100mcg fentanyl prior to arrival, 20 LAC.  EMS vitals BP 150/118 HR 84 98 on room air Alert and oriented x4

## 2024-04-27 NOTE — ED Provider Notes (Signed)
 [place] [facility]PART[facility] AT [facility] Provider Note   CSN: [id number] Arrival date & time: 04/27/24  1257     Patient presents with: right side pain   John Krueger is a 33 y.o. male history of epilepsy, alcohol use presents with complaints of right-sided pain.  Patient states that he has been having this pain on and off for the past 4 years.  Reports that he was offered surgery on his back years ago but he has not pursued it.  He reports that he is ready for surgery now.  States that he had a seizure yesterday and again this morning.  Reports compliance with his medications.  Reports he bit his tongue.  No urinary incontinence.   HPI  Past Medical History:  Diagnosis Date   Seizures (HCC)        Prior to Admission medications   Medication Sig Start Date End Date Taking? Authorizing Provider  B Complex-C (B-COMPLEX WITH VITAMIN C) tablet Take 1 tablet by mouth daily. 02/17/23   Carrion-Carrero, Marlo, MD  diclofenac  (VOLTAREN ) 75 MG EC tablet Take 1 tablet (75 mg total) by mouth 2 (two) times daily. 04/05/24   Rolinda Rogue, MD  folic acid  (FOLVITE ) 1 MG tablet TAKE 1 TABLET BY MOUTH DAILY 07/07/23   Tharon Lung, MD  levETIRAcetam  (KEPPRA ) 750 MG tablet Take 2 tablets (1,500 mg total) by mouth 2 (two) times daily. 09/22/23 12/21/23  Melvenia Motto, MD  Methocarbamol  1000 MG TABS Take 1,000 mg by mouth every 8 (eight) hours as needed. 04/05/24   Rolinda Rogue, MD    Allergies: Patient has no known allergies.    Review of Systems  Musculoskeletal:  Positive for arthralgias.    Updated Vital Signs BP 119/86   Pulse 94   Temp 98 F (36.7 C) (Oral)   Resp 16   Ht 5' 6 (1.676 m)   Wt 72.6 kg   SpO2 96%   BMI 25.83 kg/m   Physical Exam Vitals and nursing note reviewed.  Constitutional:      General: He is not in acute distress.    Appearance: He is well-developed.  HENT:     Head: Normocephalic and atraumatic.  Eyes:     Conjunctiva/sclera: Conjunctivae  normal.  Cardiovascular:     Rate and Rhythm: Normal rate and regular rhythm.     Heart sounds: No murmur heard. Pulmonary:     Effort: Pulmonary effort is normal. No respiratory distress.     Breath sounds: Normal breath sounds.  Abdominal:     Palpations: Abdomen is soft.     Tenderness: There is no abdominal tenderness.  Musculoskeletal:        General: No swelling.     Cervical back: Neck supple.  Skin:    General: Skin is warm and dry.     Capillary Refill: Capillary refill takes less than 2 seconds.  Neurological:     Mental Status: He is alert.     Comments: Patient is alert and oriented. There is no abnormal phonation. Symmetric smile without facial droop.  Moves all extremities spontaneously. 5/5 strength in upper and lower extremities. . No sensation deficit. There is no nystagmus. EOMI, PERRL.   Psychiatric:        Mood and Affect: Mood normal.     (all labs ordered are listed, but only abnormal results are displayed) Labs Reviewed  CBC WITH DIFFERENTIAL/PLATELET  COMPREHENSIVE METABOLIC PANEL WITH GFR  MAGNESIUM   LEVETIRACETAM  LEVEL  VALPROIC ACID  LEVEL  EKG: None  Radiology: No results found.   Procedures   Medications Ordered in the ED  ketorolac  (TORADOL ) 15 MG/ML injection 15 mg (has no administration in time range)  diazepam  (VALIUM ) injection 2.5 mg (has no administration in time range)  levETIRAcetam  (KEPPRA ) undiluted injection 1,000 mg (has no administration in time range)    Clinical Course as of 04/27/24 1548  Wed Apr 27, 2024  1531 Right sided pain after seizure. Hx of seizures. No injury. Last seizure in the middle of the night. Question compliance with meds. FU on labs, Keppra  load and likely dc home [BH]    Clinical Course User Index [BH] Henderly, Britni A, PA-C                                 Medical Decision Making Amount and/or Complexity of Data Reviewed Labs: ordered.  Risk Prescription drug management.   This patient  presents to the ED with chief complaint(s) of back pain  .  The complaint involves an extensive differential diagnosis and also carries with it a high risk of complications and morbidity.   Pertinent past medical history as listed in HPI  The differential diagnosis includes  Musculoskeletal, seizure, cauda equina, spinal abscess   additional history obtained:  Records reviewed Care Everywhere/External Records  Assessment and management:   Hemodynamically stable, nontoxic-appearing patient presenting with chronic back pain with associated radicular symptoms down his right side.  He was offered surgery on his back years ago but this has been ongoing for 4 years now.  Appears that he has had multiple presentations with right side pain following his seizure.  He has had numerous scans which have proved unremarkable.  He will endorse intermittent numbness and tingling primarily in his right leg, otherwise no other neurodeficits noted on exam.  He does have right-sided paraspinal tenderness, no midline tenderness.  Do not feel that any repeat imaging is indicated today.  Will load with Keppra  and treat pain with Toradol  and Valium .  Obtain EKG and routine labs to evaluate for any other abnormality.  Independent ECG interpretation:  pending  Independent labs interpretation:  The following labs were independently interpreted:  pending  Independent visualization and interpretation of imaging: I independently visualized the following imaging with scope of interpretation limited to determining acute life threatening conditions related to emergency care: Will hold off on imaging at this time as it appears patient has had numerous visits with similar presentations   Consultations obtained:   None at this time  Disposition:   Signout given to [employer], PA-C.  Please see her note for remainder the visit.  Disposition pending workup.  Social Determinants of Health:   none  This note was  dictated with voice recognition software.  Despite best efforts at proofreading, errors may have occurred which can change the documentation meaning.       Final diagnoses:  Seizure [facility])    ED Discharge Orders     None          Donnajean Lynwood DEL, PA-C 04/27/24 1548    Elnor Savant A, DO 04/29/24 1601
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
## 2024-04-29 LAB — LEVETIRACETAM LEVEL: Levetiracetam Lvl: 11.5 ug/mL (ref 10.0–40.0)

## 2024-06-20 ENCOUNTER — Emergency Department (HOSPITAL_COMMUNITY)
Admission: EM | Admit: 2024-06-20 | Discharge: 2024-06-21 | Disposition: A | Discharge status: Home / Self Care | Attending: Emergency Medicine | Admitting: Emergency Medicine

## 2024-06-20 DIAGNOSIS — R569 Unspecified convulsions: Secondary | ICD-10-CM

## 2024-06-20 DIAGNOSIS — G40909 Epilepsy, unspecified, not intractable, without status epilepticus: Secondary | ICD-10-CM | POA: Insufficient documentation

## 2024-06-20 MED ORDER — LEVETIRACETAM (KEPPRA) 500 MG/5 ML ADULT IV PUSH
1000.0000 mg | Freq: Once | INTRAVENOUS | Status: AC
  Administered 2024-06-21: 1000 mg via INTRAVENOUS
  Filled 2024-06-20: qty 10

## 2024-06-20 NOTE — ED Provider Notes (Signed)
 [facility] DE[facility]MENT AT [facility] Provider Note   CSN: [id number] Arrival date & time: 06/20/24  2326     Patient presents with: Seizures   John Krueger is a 33 y.o. male.  {Add pertinent medical, surgical, social history, OB history to HPI:32947} 85 male with history of seizures brought in by EMS from home.  Patient states that he was going to mop the floor when he had a seizure.  He states this was witnessed by his family, did not hit head.  Reports headache and pain in his back is typical after his seizures.  He reports compliance with his medications and is frustrated that he still has seizures.  He states his last seizure was about 3 weeks ago.       Prior to Admission medications   Medication Sig Start Date End Date Taking? Authorizing Provider  B Complex-C (B-COMPLEX WITH VITAMIN C) tablet Take 1 tablet by mouth daily. 02/17/23   Carrion-Carrero, Marlo, MD  diclofenac  (VOLTAREN ) 75 MG EC tablet Take 1 tablet (75 mg total) by mouth 2 (two) times daily. 04/05/24   Rolinda Rogue, MD  folic acid  (FOLVITE ) 1 MG tablet TAKE 1 TABLET BY MOUTH DAILY 07/07/23   Tharon Lung, MD  levETIRAcetam  (KEPPRA ) 750 MG tablet Take 2 tablets (1,500 mg total) by mouth 2 (two) times daily. 09/22/23 12/21/23  Melvenia Motto, MD  Methocarbamol  1000 MG TABS Take 1,000 mg by mouth every 8 (eight) hours as needed. 04/05/24   Rolinda Rogue, MD    Allergies: Patient has no known allergies.    Review of Systems Negative except as per HPI Updated Vital Signs BP (!) 131/96 (BP Location: Right Arm)   Pulse 93   Temp 98.7 F (37.1 C) (Oral)   Resp 18   SpO2 99%   Physical Exam Vitals and nursing note reviewed.  Constitutional:      General: He is not in acute distress.    Appearance: He is well-developed. He is not diaphoretic.  HENT:     Head: Normocephalic and atraumatic.     Nose: Nose normal.     Mouth/Throat:     Mouth: Mucous membranes are moist.  Eyes:     Pupils:  Pupils are equal, round, and reactive to light.  Cardiovascular:     Rate and Rhythm: Normal rate and regular rhythm.     Heart sounds: Normal heart sounds.  Pulmonary:     Effort: Pulmonary effort is normal.     Breath sounds: Normal breath sounds.  Abdominal:     Palpations: Abdomen is soft.     Tenderness: There is no abdominal tenderness.  Musculoskeletal:     Cervical back: Normal range of motion and neck supple. No tenderness.     Right lower leg: No edema.     Left lower leg: No edema.  Skin:    General: Skin is warm and dry.     Findings: No erythema or rash.  Neurological:     Mental Status: He is alert and oriented to person, place, and time.     Sensory: No sensory deficit.     Motor: No weakness.     Coordination: Coordination normal.  Psychiatric:        Behavior: Behavior normal.     (all labs ordered are listed, but only abnormal results are displayed) Labs Reviewed  CBC WITH DIFFERENTIAL/PLATELET  COMPREHENSIVE METABOLIC PANEL WITH GFR  MAGNESIUM   VALPROIC ACID  LEVEL  LEVETIRACETAM  LEVEL  EKG: None  Radiology: No results found.  {Document cardiac monitor, telemetry assessment procedure when appropriate:32947} Procedures   Medications Ordered in the ED  levETIRAcetam  (KEPPRA ) undiluted injection 1,000 mg (has no administration in time range)      {Click here for ABCD2, HEART and other calculators REFRESH Note before signing:1}                              Medical Decision Making Amount and/or Complexity of Data Reviewed Labs: ordered.   This patient presents to the ED for concern of ***, this involves an extensive number of treatment options, and is a complaint that carries with it a high risk of complications and morbidity.  The differential diagnosis includes ***   Co morbidities / Chronic conditions that complicate the patient evaluation  ***   Additional history obtained:  Additional history obtained from EMR External records  from outside source obtained and reviewed including ***   Lab Tests:  I Ordered, and personally interpreted labs.  The pertinent results include:  ***   Imaging Studies ordered:  I ordered imaging studies including ***  I independently visualized and interpreted imaging which showed *** I agree with the radiologist interpretation   Cardiac Monitoring: / EKG:  The patient was maintained on a cardiac monitor.  I personally viewed and interpreted the cardiac monitored which showed an underlying rhythm of: ***   Problem List / ED Course / Critical interventions / Medication management  *** I ordered medication including ***   Reevaluation of the patient after these medicines showed that the patient *** I have reviewed the patients home medicines and have made adjustments as needed   Consultations Obtained:  I requested consultation with the ***,  and discussed lab and imaging findings as well as pertinent plan - they recommend: ***   Social Determinants of Health:  ***   Test / Admission - Considered:  ***   {Document critical care time when appropriate  Document review of labs and clinical decision tools ie CHADS2VASC2, etc  Document your independent review of radiology images and any outside records  Document your discussion with family members, caretakers and with consultants  Document social determinants of health affecting pt's care  Document your decision making why or why not admission, treatments were needed:32947:::1}   Final diagnoses:  None    ED Discharge Orders     None

## 2024-06-20 NOTE — ED Triage Notes (Signed)
 Pt BIBA from home c/o unwitnessed seizure. Per EMS, pt was able to answer questions appropriately. Takes Keppra . Pt did fall but no LOC, did not hit his head. Last seizure 5-6 weeks ago. Pt complains of headache, stiff neck, and back pain. Per EMS, vital WNL.

## 2024-06-21 ENCOUNTER — Other Ambulatory Visit: Payer: Self-pay

## 2024-06-21 LAB — COMPREHENSIVE METABOLIC PANEL WITH GFR
ALT: 19 U/L (ref 0–44)
AST: 45 U/L — ABNORMAL HIGH (ref 15–41)
Albumin: 3.5 g/dL (ref 3.5–5.0)
Alkaline Phosphatase: 68 U/L (ref 38–126)
Anion gap: 11 (ref 5–15)
BUN: 9 mg/dL (ref 6–20)
CO2: 25 mmol/L (ref 22–32)
Calcium: 8.4 mg/dL — ABNORMAL LOW (ref 8.9–10.3)
Chloride: 99 mmol/L (ref 98–111)
Creatinine, Ser: 0.7 mg/dL (ref 0.61–1.24)
GFR, Estimated: >60 mL/min (ref >60)
Glucose, Bld: 78 mg/dL (ref 70–99)
Potassium: 4 mmol/L (ref 3.5–5.1)
Sodium: 135 mmol/L (ref 135–145)
Total Bilirubin: 0.5 mg/dL (ref 0.0–1.2)
Total Protein: 6.7 g/dL (ref 6.5–8.1)

## 2024-06-21 LAB — CBC WITH DIFFERENTIAL/PLATELET
Abs Immature Granulocytes: 0.02 K/uL (ref 0.00–0.07)
Basophils Absolute: 0 K/uL (ref 0.0–0.1)
Basophils Relative: 0 %
Eosinophils Absolute: 0 K/uL (ref 0.0–0.5)
Eosinophils Relative: 1 %
HCT: 39.5 % (ref 39.0–52.0)
Hemoglobin: 12.9 g/dL — ABNORMAL LOW (ref 13.0–17.0)
Immature Granulocytes: 0 %
Lymphocytes Relative: 30 %
Lymphs Abs: 2.2 K/uL (ref 0.7–4.0)
MCH: 30 pg (ref 26.0–34.0)
MCHC: 32.7 g/dL (ref 30.0–36.0)
MCV: 91.9 fL (ref 80.0–100.0)
Monocytes Absolute: 0.6 K/uL (ref 0.1–1.0)
Monocytes Relative: 8 %
Neutro Abs: 4.4 K/uL (ref 1.7–7.7)
Neutrophils Relative %: 61 %
Platelets: 170 K/uL (ref 150–400)
RBC: 4.3 MIL/uL (ref 4.22–5.81)
RDW: 15.1 % (ref 11.5–15.5)
WBC: 7.2 K/uL (ref 4.0–10.5)
nRBC: 0 % (ref 0.0–0.2)

## 2024-06-21 LAB — MAGNESIUM: Magnesium: 1.6 mg/dL — ABNORMAL LOW (ref 1.7–2.4)

## 2024-06-21 LAB — VALPROIC ACID LEVEL: Valproic Acid Lvl: <10 ug/mL — ABNORMAL LOW (ref 50–100)

## 2024-06-21 MED ORDER — MAGNESIUM SULFATE 2 GM/50ML IV SOLN
2.0000 g | Freq: Once | INTRAVENOUS | Status: AC
  Administered 2024-06-21: 2 g via INTRAVENOUS
  Filled 2024-06-21: qty 50

## 2024-06-21 NOTE — Discharge Instructions (Addendum)
 Follow-up with your care team.  Return to the ER as needed.   Per [place] statutes, patients with seizures are not allowed to drive until they have been seizure-free for six months.  Other recommendations include using caution when using heavy equipment or power tools. Avoid working on ladders or at heights. Take showers instead of baths.  Do not swim alone.  Ensure the water temperature is not too high on the home water heater. Do not go swimming alone. Do not lock yourself in a room alone (i.e. bathroom). When caring for infants or small children, sit down when holding, feeding, or changing them to minimize risk of injury to the child in the event you have a seizure. Maintain good sleep hygiene. Avoid alcohol.  Also recommend adequate sleep, hydration, good diet and minimize stress.     During the Seizure   - First, ensure adequate ventilation and place patients on the floor on their left side  Loosen clothing around the neck and ensure the airway is patent. If the patient is clenching the teeth, do not force the mouth open with any object as this can cause severe damage - Remove all items from the surrounding that can be hazardous. The patient may be oblivious to what's happening and may not even know what he or she is doing. If the patient is confused and wandering, either gently guide him/her away and block access to outside areas - Reassure the individual and be comforting - Call 911. In most cases, the seizure ends before EMS arrives. However, there are cases when seizures may last over 3 to 5 minutes. Or the individual may have developed breathing difficulties or severe injuries. If a pregnant patient or a person with diabetes develops a seizure, it is prudent to call an ambulance. - Finally, if the patient does not regain full consciousness, then call EMS. Most patients will remain confused for about 45 to 90 minutes after a seizure, so you must use judgment in calling for help. -  Avoid restraints but make sure the patient is in a bed with padded side rails - Place the individual in a lateral position with the neck slightly flexed; this will help the saliva drain from the mouth and prevent the tongue from falling backward - Remove all nearby furniture and other hazards from the area - Provide verbal assurance as the individual is regaining consciousness - Provide the patient with privacy if possible - Call for help and start treatment as ordered by the caregiver   After the Seizure (Postictal Stage)   After a seizure, most patients experience confusion, fatigue, muscle pain and/or a headache. Thus, one should permit the individual to sleep. For the next few days, reassurance is essential. Being calm and helping reorient the person is also of importance.   Most seizures are painless and end spontaneously. Seizures are not harmful to others but can lead to complications such as stress on the lungs, brain and the heart. Individuals with prior lung problems may develop labored breathing and respiratory distress.

## 2024-06-22 LAB — LEVETIRACETAM LEVEL: Levetiracetam Lvl: 25.2 ug/mL (ref 10.0–40.0)
